# Patient Record
Sex: Female | Born: 1985 | Race: White | Hispanic: No | Marital: Married | Smoking: Former smoker
Health system: Southern US, Community
[De-identification: ages and names within clinical notes are randomized; demographics above are authoritative.]

## PROBLEM LIST (undated history)

## (undated) ENCOUNTER — Inpatient Hospital Stay: Payer: Self-pay

## (undated) DIAGNOSIS — R519 Headache, unspecified: Secondary | ICD-10-CM

## (undated) DIAGNOSIS — Z9889 Other specified postprocedural states: Secondary | ICD-10-CM

## (undated) DIAGNOSIS — F32A Depression, unspecified: Secondary | ICD-10-CM

## (undated) DIAGNOSIS — F329 Major depressive disorder, single episode, unspecified: Secondary | ICD-10-CM

## (undated) DIAGNOSIS — R112 Nausea with vomiting, unspecified: Secondary | ICD-10-CM

## (undated) DIAGNOSIS — F419 Anxiety disorder, unspecified: Secondary | ICD-10-CM

## (undated) HISTORY — PX: CHOLECYSTECTOMY: SHX55

---
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
## 2010-03-13 ENCOUNTER — Emergency Department: Payer: Self-pay | Admitting: Emergency Medicine

## 2010-03-15 ENCOUNTER — Ambulatory Visit: Payer: Self-pay | Admitting: Family Medicine

## 2010-04-08 ENCOUNTER — Ambulatory Visit (HOSPITAL_COMMUNITY): Admission: RE | Admit: 2010-04-08 | Discharge: 2010-04-08 | Payer: Self-pay | Admitting: Neurology

## 2010-11-26 ENCOUNTER — Inpatient Hospital Stay: Payer: Self-pay | Admitting: Psychiatry

## 2011-04-27 ENCOUNTER — Emergency Department: Payer: Self-pay | Admitting: Emergency Medicine

## 2012-07-06 ENCOUNTER — Ambulatory Visit: Payer: Self-pay | Admitting: Orthopedic Surgery

## 2012-07-06 LAB — HCG, QUANTITATIVE, PREGNANCY: Beta Hcg, Quant.: <1 m[IU]/mL — ABNORMAL LOW

## 2013-05-11 ENCOUNTER — Observation Stay: Payer: Self-pay | Admitting: Obstetrics & Gynecology

## 2013-05-11 LAB — URINALYSIS, COMPLETE
Bilirubin,UR: NEGATIVE
Ketone: NEGATIVE
Leukocyte Esterase: NEGATIVE
Protein: NEGATIVE
RBC,UR: 1 /HPF (ref 0–5)
Specific Gravity: 1.018 (ref 1.003–1.030)
Squamous Epithelial: 1
WBC UR: 2 /HPF (ref 0–5)

## 2013-05-11 LAB — CBC WITH DIFFERENTIAL/PLATELET
Basophil #: 0 10*3/uL (ref 0.0–0.1)
Eosinophil #: 0.1 10*3/uL (ref 0.0–0.7)
HCT: 30.8 % — ABNORMAL LOW (ref 35.0–47.0)
HGB: 10.4 g/dL — ABNORMAL LOW (ref 12.0–16.0)
Lymphocyte %: 15.7 %
MCH: 29.1 pg (ref 26.0–34.0)
MCHC: 33.8 g/dL (ref 32.0–36.0)
Monocyte #: 0.8 x10 3/mm (ref 0.2–0.9)
Neutrophil #: 8.9 10*3/uL — ABNORMAL HIGH (ref 1.4–6.5)
Neutrophil %: 76.6 %
Platelet: 270 10*3/uL (ref 150–440)
RBC: 3.59 10*6/uL — ABNORMAL LOW (ref 3.80–5.20)

## 2013-06-10 ENCOUNTER — Encounter: Payer: Self-pay | Admitting: Maternal and Fetal Medicine

## 2013-08-23 ENCOUNTER — Observation Stay: Payer: Self-pay | Admitting: Obstetrics and Gynecology

## 2013-08-23 LAB — PROTEIN / CREATININE RATIO, URINE
Creatinine, Urine: 125.7 mg/dL — ABNORMAL HIGH (ref 30.0–125.0)
Protein/Creat. Ratio: 175 mg/gCREAT (ref 0–200)

## 2013-08-31 ENCOUNTER — Observation Stay: Payer: Self-pay | Admitting: Obstetrics and Gynecology

## 2013-08-31 LAB — PROTEIN / CREATININE RATIO, URINE
CREATININE, URINE: 85.8 mg/dL (ref 30.0–125.0)
PROTEIN/CREAT. RATIO: 163 mg/g{creat} (ref 0–200)
Protein, Random Urine: 14 mg/dL — ABNORMAL HIGH (ref 0–12)

## 2013-09-08 ENCOUNTER — Observation Stay: Payer: Self-pay

## 2013-09-09 ENCOUNTER — Inpatient Hospital Stay: Payer: Self-pay

## 2013-09-09 LAB — CBC WITH DIFFERENTIAL/PLATELET
Basophil #: 0 10*3/uL (ref 0.0–0.1)
Basophil %: 0.3 %
EOS ABS: 0.1 10*3/uL (ref 0.0–0.7)
EOS PCT: 0.8 %
HCT: 33.4 % — ABNORMAL LOW (ref 35.0–47.0)
HGB: 11.4 g/dL — ABNORMAL LOW (ref 12.0–16.0)
LYMPHS PCT: 15 %
Lymphocyte #: 2.4 10*3/uL (ref 1.0–3.6)
MCH: 28.5 pg (ref 26.0–34.0)
MCHC: 34.2 g/dL (ref 32.0–36.0)
MCV: 83 fL (ref 80–100)
MONO ABS: 1.3 x10 3/mm — AB (ref 0.2–0.9)
Monocyte %: 8 %
NEUTROS PCT: 75.9 %
Neutrophil #: 12.2 10*3/uL — ABNORMAL HIGH (ref 1.4–6.5)
Platelet: 243 10*3/uL (ref 150–440)
RBC: 4 10*6/uL (ref 3.80–5.20)
RDW: 14.6 % — ABNORMAL HIGH (ref 11.5–14.5)
WBC: 16.1 10*3/uL — ABNORMAL HIGH (ref 3.6–11.0)

## 2013-09-09 LAB — GC/CHLAMYDIA PROBE AMP

## 2013-09-10 LAB — HEMATOCRIT: HCT: 29.3 % — AB (ref 35.0–47.0)

## 2013-09-19 ENCOUNTER — Emergency Department: Payer: Self-pay | Admitting: Emergency Medicine

## 2013-09-19 LAB — URINALYSIS, COMPLETE
Bilirubin,UR: NEGATIVE
GLUCOSE, UR: NEGATIVE mg/dL (ref 0–75)
KETONE: NEGATIVE
Nitrite: NEGATIVE
Ph: 6 (ref 4.5–8.0)
Protein: NEGATIVE
SPECIFIC GRAVITY: 1.008 (ref 1.003–1.030)
Squamous Epithelial: 1
WBC UR: 60 /HPF (ref 0–5)

## 2013-09-19 LAB — CBC WITH DIFFERENTIAL/PLATELET
BASOS PCT: 0.7 %
Basophil #: 0.1 10*3/uL (ref 0.0–0.1)
EOS PCT: 1.9 %
Eosinophil #: 0.2 10*3/uL (ref 0.0–0.7)
HCT: 39.8 % (ref 35.0–47.0)
HGB: 12.8 g/dL (ref 12.0–16.0)
LYMPHS PCT: 22.8 %
Lymphocyte #: 2.9 10*3/uL (ref 1.0–3.6)
MCH: 27.7 pg (ref 26.0–34.0)
MCHC: 32 g/dL (ref 32.0–36.0)
MCV: 86 fL (ref 80–100)
MONOS PCT: 7.1 %
Monocyte #: 0.9 x10 3/mm (ref 0.2–0.9)
NEUTROS ABS: 8.6 10*3/uL — AB (ref 1.4–6.5)
NEUTROS PCT: 67.5 %
Platelet: 365 10*3/uL (ref 150–440)
RBC: 4.61 10*6/uL (ref 3.80–5.20)
RDW: 14.3 % (ref 11.5–14.5)
WBC: 12.8 10*3/uL — ABNORMAL HIGH (ref 3.6–11.0)

## 2013-09-19 LAB — COMPREHENSIVE METABOLIC PANEL
ALBUMIN: 3.1 g/dL — AB (ref 3.4–5.0)
ALK PHOS: 117 U/L
Anion Gap: 6 — ABNORMAL LOW (ref 7–16)
BILIRUBIN TOTAL: 0.4 mg/dL (ref 0.2–1.0)
BUN: 14 mg/dL (ref 7–18)
CREATININE: 0.59 mg/dL — AB (ref 0.60–1.30)
Calcium, Total: 8.8 mg/dL (ref 8.5–10.1)
Chloride: 108 mmol/L — ABNORMAL HIGH (ref 98–107)
Co2: 25 mmol/L (ref 21–32)
EGFR (African American): >60
EGFR (Non-African Amer.): >60
Glucose: 101 mg/dL — ABNORMAL HIGH (ref 65–99)
Osmolality: 278 (ref 275–301)
POTASSIUM: 4.4 mmol/L (ref 3.5–5.1)
SGOT(AST): 63 U/L — ABNORMAL HIGH (ref 15–37)
SGPT (ALT): 43 U/L (ref 12–78)
SODIUM: 139 mmol/L (ref 136–145)
Total Protein: 7.5 g/dL (ref 6.4–8.2)

## 2013-09-19 LAB — TROPONIN I

## 2013-09-19 LAB — LIPASE, BLOOD: LIPASE: 147 U/L (ref 73–393)

## 2013-09-25 ENCOUNTER — Ambulatory Visit: Payer: Self-pay | Admitting: Surgery

## 2013-09-25 LAB — CBC WITH DIFFERENTIAL/PLATELET
Basophil #: 0.1 10*3/uL (ref 0.0–0.1)
Basophil %: 1.3 %
Eosinophil #: 0.2 10*3/uL (ref 0.0–0.7)
Eosinophil %: 3 %
HCT: 38.8 % (ref 35.0–47.0)
HGB: 12.4 g/dL (ref 12.0–16.0)
LYMPHS ABS: 2.7 10*3/uL (ref 1.0–3.6)
Lymphocyte %: 36.6 %
MCH: 27.9 pg (ref 26.0–34.0)
MCHC: 32.1 g/dL (ref 32.0–36.0)
MCV: 87 fL (ref 80–100)
Monocyte #: 0.5 x10 3/mm (ref 0.2–0.9)
Monocyte %: 7 %
NEUTROS ABS: 3.8 10*3/uL (ref 1.4–6.5)
Neutrophil %: 52.1 %
Platelet: 293 10*3/uL (ref 150–440)
RBC: 4.47 10*6/uL (ref 3.80–5.20)
RDW: 14.3 % (ref 11.5–14.5)
WBC: 7.4 10*3/uL (ref 3.6–11.0)

## 2013-09-25 LAB — URINALYSIS, COMPLETE
Bilirubin,UR: NEGATIVE
GLUCOSE, UR: NEGATIVE mg/dL (ref 0–75)
Ketone: NEGATIVE
NITRITE: NEGATIVE
PH: 6.5 (ref 4.5–8.0)
Specific Gravity: 1.02 (ref 1.003–1.030)

## 2013-09-25 LAB — COMPREHENSIVE METABOLIC PANEL
ALBUMIN: 3.3 g/dL — AB (ref 3.4–5.0)
ALK PHOS: 94 U/L
ALT: 43 U/L (ref 12–78)
ANION GAP: 8 (ref 7–16)
BILIRUBIN TOTAL: 0.3 mg/dL (ref 0.2–1.0)
BUN: 15 mg/dL (ref 7–18)
CHLORIDE: 104 mmol/L (ref 98–107)
Calcium, Total: 9.1 mg/dL (ref 8.5–10.1)
Co2: 29 mmol/L (ref 21–32)
Creatinine: 0.8 mg/dL (ref 0.60–1.30)
EGFR (African American): >60
EGFR (Non-African Amer.): >60
GLUCOSE: 88 mg/dL (ref 65–99)
OSMOLALITY: 282 (ref 275–301)
POTASSIUM: 4 mmol/L (ref 3.5–5.1)
SGOT(AST): 26 U/L (ref 15–37)
SODIUM: 141 mmol/L (ref 136–145)
Total Protein: 7.2 g/dL (ref 6.4–8.2)

## 2013-10-08 ENCOUNTER — Ambulatory Visit: Payer: Self-pay | Admitting: Surgery

## 2013-10-10 LAB — PATHOLOGY REPORT

## 2014-12-20 NOTE — Op Note (Signed)
PATIENT NAME:  [name] Crosby, [name] MR#:  [id number] DATE OF BIRTH:  1986/03/04  DATE OF PROCEDURE:  10/08/2013  PREOPERATIVE DIAGNOSIS: Symptomatic cholelithiasis.   POSTOPERATIVE DIAGNOSIS: Symptomatic cholelithiasis.   PROCEDURE: Laparoscopic cholecystectomy.   SURGEON: Dionne Miloichard Flavia Bruss, M.D.   ANESTHESIA: General with endotracheal tube.   ASSISTANT:  Applebaum, PA-S.   INDICATIONS: This is a patient with recurrent epigastric and right upper quadrant pain associated with fatty food intolerance and workup showing gallstones. Preoperatively, we discussed the rationale for surgery, the options of observation, risk of bleeding, infection, recurrence of symptoms, inability to resolve her symptoms, open procedure, bile duct damage, bile duct leak, retained common bile duct stone, any of which could require further surgery and/or ERCP, stent or papillotomy. This was all reviewed for her in the preop holding area. She understood and agreed to proceed.   FINDINGS: A scar in the anterior surface of the gallbladder with small stones, all signifying prior attacks of gallbladder disease.   DESCRIPTION OF PROCEDURE: The patient was induced with general anesthesia and given IV antibiotics. VT prophylaxis was in place. She was prepped and draped in a sterile fashion. Marcaine was infiltrated in skin and subcutaneous tissues around the infraumbilical area, avoiding an area of piercing. Local anesthetic was infiltrated and then an incision was made. Veress needle was placed. Pneumoperitoneum was obtained. A 5 mm trocar port was placed. The abdominal cavity was explored, and under direct vision, a 10 mm epigastric port and 2 lateral 5 mm ports were placed. The gallbladder was placed on tension. The peritoneum over the infundibulum was incised bluntly. The cystic artery was well identified, doubly clipped and divided. This allowed for good visualization of the cystic duct as I entered the infundibulum. Here, it was doubly  clipped and divided, and the gallbladder was taken from and the gallbladder fossa with electrocautery.  An additional branch of the cystic artery was doubly clipped and divided and then the gallbladder was removed from the gallbladder fossa, passed out through the epigastric port site with the aid of an Endo Catch bag. The area was checked for hemostasis, found to be adequate. There was no sign of bleeding, bile leak or bowel injury. The camera was placed in the epigastric site to view back at the periumbilical site. There was no sign of bleeding or bowel injury or adhesions. Irrigation had been performed; therefore, pneumoperitoneum was released. All ports were removed.  The fascial edges of the epigastric site were approximated with figure-of-eight 0 Vicryls; 4-0 subcuticular Monocryl was used on all skin edges. Steri-Strips, Mastisol and sterile dressings were placed.   The patient tolerated the procedure well. There were no complications. She was taken to the recovery room in stable condition to be discharged in the care of her family. Follow up in 10 days.     ____________________________ Adah Salvageichard E. Excell Seltzerooper, MD rec:dmm D: 10/08/2013 10:44:09 ET T: 10/08/2013 11:05:38 ET JOB#: [id number]  cc: Adah Salvageichard E. Excell Seltzerooper, MD, <Dictator> Lattie HawICHARD E Reice Bienvenue MD ELECTRONICALLY SIGNED 10/08/2013 17:27

## 2014-12-20 NOTE — H&P (Signed)
PATIENT NAME:  [name] Crosby, [name] MR#:  [id number] DATE OF BIRTH:  [birth year]  DATE OF ADMISSION:  10/08/2013  CHIEF COMPLAINT: Recurrent epigastric pain.   HISTORY OF PRESENT ILLNESS: This is a patient with recurrent epigastric right upper quadrant pain associated with fatty food intolerance. She is postpartum and is waiting to go back to work, but has had recurrent episodic pain associated with fatty food intolerance. No history of jaundice or acholic stools. No fevers or chills. She has had some nausea, but no emesis. She was seen in the office where elective laparoscopic cholecystectomy was discussed which she agrees too.  She is here for elective cholecystectomy for control of her symptoms.   PAST MEDICAL HISTORY: Anxiety.   PAST SURGICAL HISTORY: None.   ALLERGIES: None.   FAMILY HISTORY: Noncontributory.   SOCIAL HISTORY: She does not smoke, does not drink alcohol and is at home with her children until going back to work.   REVIEW OF SYSTEMS: A 10 system review has been performed and negative with the exception I mentioned in the HPI.   PHYSICAL EXAMINATION: GENERAL: Healthy-appearing female patient.  HEENT: No scleral icterus.  NECK: No palpable neck nodes.  CHEST: Clear to auscultation.  CARDIAC: Regular rate and rhythm.  ABDOMEN: Soft, nontender.  EXTREMITIES: Without edema.  NEUROLOGIC: Grossly intact.  INTEGUMENT: No jaundice.   LABORATORY VALUES: Show no sign of choledocholithiasis. Ultrasound shows stones.   ASSESSMENT AND PLAN: This is a patient with symptomatic cholelithiasis with recurrent episodic right upper quadrant pain and epigastric pain associated with fatty food intolerance and work-up showing gallstones. She is here for elective laparoscopic cholecystectomy.   We discussed the rationale for surgery, the options of observation, risk of bleeding, infection, recurrence of symptoms, failure to resolve her symptoms, open procedure, bile duct damage, bile duct leak,  retained common bile duct stone, any of which could require further surgery and/or ERCP stent, and papillotomy. This will be reviewed for her in the preop holding area as well prior to surgery.   ____________________________ Adah Salvageichard E. Excell Seltzerooper, MD rec:sg D: 10/08/2013 07:30:40 ET T: 10/08/2013 07:43:47 ET JOB#: [id number]  cc: Adah Salvageichard E. Excell Seltzerooper, MD, <Dictator> Lattie HawICHARD E Tenzin Edelman MD ELECTRONICALLY SIGNED 10/08/2013 8:18

## 2015-01-06 NOTE — H&P (Signed)
L&D Evaluation:  History Expanded:  HPI 29 yo G2P0010 at 37 weeks presentswith ctx and spotting. She was seen on Monday and she was checked and she has been having a bloody discharge since then.   Gravida 2   Term 0   PreTerm 0   Abortion 1   Living 0   Blood Type (Maternal) O positive   Group B Strep Results Maternal (Result >5wks must be treated as unknown) unknown/result > 5 weeks ago   Maternal HIV Negative   Maternal Syphilis Ab Nonreactive   Maternal Varicella Immune   Rubella Results (Maternal) immune   Maternal T-Dap Unknown   [facility]   Presents with contractions, vaginal bleeding   Patient's Medical History No Chronic Illness   Patient's Surgical History none   Medications Pre Natal Vitamins   Allergies NKDA   Social History none   ROS:  ROS All systems were reviewed.  HEENT, CNS, GI, GU, Respiratory, CV, Renal and Musculoskeletal systems were found to be normal.   Exam:  Vital Signs stable  first BP was 148/78   Urine Protein not completed   General no apparent distress   Mental Status clear   Chest clear   Heart normal sinus rhythm   Abdomen gravid, non-tender   Estimated Fetal Weight Average for gestational age   Back no CVAT   Edema no edema   Pelvic no external lesions   Mebranes Intact   Description clear   FHT normal rate with no decels   FHT Description CAT 1   Ucx regular   Skin dry   Lymph no lymphadenopathy   Impression:  Impression early labor   Plan:  Plan UA   Comments will give her time and serial exams. reck BP.   Follow Up Appointment need to schedule. in 1 week   Electronic Signatures: Adria DevonKlett, Tyrene Nader (MD)  (Signed 26-Dec-14 22:07)  Authored: L&D Evaluation   Last Updated: 26-Dec-14 22:07 by Adria DevonKlett, Danyale Ridinger (MD)

## 2015-01-06 NOTE — H&P (Signed)
L&D Evaluation:  History Expanded:  HPI 29 yo G2P0010 EDD of 09/12/13 per LMP and 8 wk US, presents at 6666w4d with c/o leaking fluid followed by large gush at 0130 this am. +FM, + contractions.[facility] for [facility] to care, bipolar disorder (was on lamictal, d/c in early pregnancy, restarted at 24 wks) followed by psych, TDaP given 07/08/13.   Gravida 2   Term 0   PreTerm 0   Abortion 1   Living 0   Blood Type (Maternal) O positive   Group B Strep Results Maternal (Result >5wks must be treated as unknown) negative   Maternal HIV Negative   Maternal Syphilis Ab Nonreactive   Maternal Varicella Immune   Rubella Results (Maternal) immune   Maternal T-Dap Unknown   [facility]   Presents with leaking fluid   Patient's Medical History Bipolar   Patient's Surgical History wisdom teeth   Medications Pre Natal Vitamins  Lamictal   Allergies NKDA   Social History none   ROS:  ROS All systems were reviewed.  HEENT, CNS, GI, GU, Respiratory, CV, Renal and Musculoskeletal systems were found to be normal.   Exam:  Vital Signs stable   Urine Protein not completed   General no apparent distress   Mental Status clear   Chest clear   Heart normal sinus rhythm   Abdomen gravid, tender with contractions   Estimated Fetal Weight Average for gestational age   Back no CVAT   Edema no edema   Pelvic no external lesions, 6/100/-1   Mebranes Ruptured   Description clear   FHT normal rate with no decels, category 1 tracing   Ucx regular, q 2-3 min   Skin dry   Lymph no lymphadenopathy   Impression:  Impression SROM, labor   Plan:  Comments Pt received epidural recently, RN working with anesthesia to ensure adequate pain relief.   Electronic Signatures: Vella KohlerBrothers, Edeline Greening K (CNM)  (Signed 12-Jan-15 08:46)  Authored: L&D Evaluation   Last Updated: 12-Jan-15 08:46 by Vella KohlerBrothers, Ardis Lawley K (CNM)

## 2015-01-06 NOTE — H&P (Signed)
L&D Evaluation:  History Expanded:  HPI 29 yo G2P0010 WF at 22 weeks w estimated date of confinement 09/12/13 who presents w lower suprapubic pains today.  Denies vaginal bleeding or LOF.  +FM.  Denies n/v/d, f/c, CP, SOB, h/a, LBP.   [facility].  Bipolar, on and off meds.  O+, RI, VI.   Gravida 2   Term 0   PreTerm 0   Abortion 1   Living 0   Blood Type (Maternal) O positive   Group B Strep Results Maternal (Result >5wks must be treated as unknown) unknown/result > 5 weeks ago   Maternal Varicella Immune   Rubella Results (Maternal) immune   Presents with abdominal pain   Patient's Medical History BIPOLAR   Patient's Surgical History none   Medications Pre Natal Vitamins  On Lamictal in past   Allergies NKDA   Social History none   Family History Non-Contributory   ROS:  ROS All systems were reviewed.  HEENT, CNS, GI, GU, Respiratory, CV, Renal and Musculoskeletal systems were found to be normal.   Exam:  Vital Signs stable   General no apparent distress   Mental Status clear   Chest clear   Heart normal sinus rhythm, no murmur/gallop/rubs   Abdomen gravid, non-tender   Estimated Fetal Weight Average for gestational age   Back no CVAT   Edema no edema   Pelvic no external lesions, cervix closed and thick   FHT normal rate with no decels   Ucx absent   Impression:  Impression Abd Pain, no signs Preterm Labor.   Plan:  Plan UA, EFM/NST   Comments CBC normal.   Electronic Signatures: Letitia LibraHarris, Tola Meas Paul (MD)  (Signed 13-Sep-14 13:36)  Authored: L&D Evaluation   Last Updated: 13-Sep-14 13:36 by Letitia LibraHarris, Kayleeann Huxford Paul (MD)

## 2015-01-06 NOTE — H&P (Signed)
L&D Evaluation:  History Expanded:  HPI 29 yo G2P0010 at 2879w2d gestational age by LMP whose pregnancy has been complicated by a history of bipolar disorder (was on lamictal, but discontinued once she found out she was pregnant).  She presents with contractions for several hours. She notes positive fetal movement, no leakage of fluid, no vaginal bleeding.  TDaP given 07/08/13.   Gravida 2   Term 0   PreTerm 0   Abortion 1   Living 0   Blood Type (Maternal) O positive   Group B Strep Results Maternal (Result >5wks must be treated as unknown) negative   Maternal HIV Negative   Maternal Syphilis Ab Nonreactive   Maternal Varicella Immune   Rubella Results (Maternal) immune   Maternal T-Dap Unknown   [facility]   Presents with contractions   Patient's Medical History No Chronic Illness   Patient's Surgical History none   Medications Pre Natal Vitamins   Allergies NKDA   Social History none   ROS:  ROS All systems were reviewed.  HEENT, CNS, GI, GU, Respiratory, CV, Renal and Musculoskeletal systems were found to be normal.   Exam:  Vital Signs stable  T 98.2, first BP was 141/82, since then 123-136/76-85, 80-90s, RR 20   Urine Protein not completed   General no apparent distress   Mental Status clear   Chest clear   Heart normal sinus rhythm   Abdomen gravid, non-tender   Estimated Fetal Weight Average for gestational age   Back no CVAT   Edema no edema   Pelvic no external lesions, 1-2/50%/-3   Mebranes Intact   Description clear   FHT normal rate with no decels   FHT Description 135/mod var/+accels/no decels   Ucx regular, 3 q 10 min   Skin dry   Lymph no lymphadenopathy   Impression:  Impression early labor, reactive NST, Early labor   Plan:  Comments - has been here since 3am with no apparent change in her cervix.  She states that her contractions have increased in intensity.  Will give her the option to stay for longer,  or to go home and labor there.She decides to go home for now. - preeclampsia precautions.  Elevated BP initially, urine prot/creatinine ratio normal.  no preeclampsia symptoms or severe features and BPs normal after first one.   Follow Up Appointment in 1 week   Labs:  Lab Results:  Misc Urine Chem:  03-Jan-15 03:36   Protein/Creat Ratio (comp) 163 (Result(s) reported on 31 Aug 2013 at 04:52AM.)   Electronic Signatures: Conard NovakJackson, Paeton Latouche D (MD)  (Signed 03-Jan-15 08:14)  Authored: L&D Evaluation, Labs   Last Updated: 03-Jan-15 08:14 by Conard NovakJackson, Hilberto Burzynski D (MD)

## 2015-05-15 ENCOUNTER — Other Ambulatory Visit: Payer: Self-pay | Admitting: Orthopedic Surgery

## 2015-05-15 DIAGNOSIS — M545 Low back pain, unspecified: Secondary | ICD-10-CM

## 2015-05-23 ENCOUNTER — Ambulatory Visit
Admission: RE | Admit: 2015-05-23 | Discharge: 2015-05-23 | Disposition: A | Discharge status: Home / Self Care | Payer: Commercial Managed Care - HMO | Source: Ambulatory Visit | Attending: Orthopedic Surgery | Admitting: Orthopedic Surgery

## 2015-05-23 DIAGNOSIS — M545 Low back pain, unspecified: Secondary | ICD-10-CM

## 2015-05-23 DIAGNOSIS — M5126 Other intervertebral disc displacement, lumbar region: Secondary | ICD-10-CM | POA: Insufficient documentation

## 2015-07-22 ENCOUNTER — Other Ambulatory Visit: Payer: Self-pay | Admitting: Orthopedic Surgery

## 2015-07-22 DIAGNOSIS — M2392 Unspecified internal derangement of left knee: Secondary | ICD-10-CM

## 2015-08-13 ENCOUNTER — Ambulatory Visit: Payer: Commercial Managed Care - HMO

## 2016-07-05 ENCOUNTER — Emergency Department
Admission: EM | Admit: 2016-07-05 | Discharge: 2016-07-05 | Disposition: A | Discharge status: Home / Self Care | Payer: Managed Care, Other (non HMO) | Attending: Emergency Medicine | Admitting: Emergency Medicine

## 2016-07-05 DIAGNOSIS — O2341 Unspecified infection of urinary tract in pregnancy, first trimester: Secondary | ICD-10-CM | POA: Diagnosis not present

## 2016-07-05 DIAGNOSIS — Z87891 Personal history of nicotine dependence: Secondary | ICD-10-CM | POA: Insufficient documentation

## 2016-07-05 DIAGNOSIS — O209 Hemorrhage in early pregnancy, unspecified: Secondary | ICD-10-CM | POA: Diagnosis not present

## 2016-07-05 DIAGNOSIS — Z3A01 Less than 8 weeks gestation of pregnancy: Secondary | ICD-10-CM | POA: Diagnosis not present

## 2016-07-05 DIAGNOSIS — R1084 Generalized abdominal pain: Secondary | ICD-10-CM

## 2016-07-05 DIAGNOSIS — O219 Vomiting of pregnancy, unspecified: Secondary | ICD-10-CM | POA: Diagnosis present

## 2016-07-05 DIAGNOSIS — R112 Nausea with vomiting, unspecified: Secondary | ICD-10-CM

## 2016-07-05 DIAGNOSIS — R197 Diarrhea, unspecified: Secondary | ICD-10-CM | POA: Insufficient documentation

## 2016-07-05 DIAGNOSIS — N939 Abnormal uterine and vaginal bleeding, unspecified: Secondary | ICD-10-CM

## 2016-07-05 LAB — COMPREHENSIVE METABOLIC PANEL
ALK PHOS: 46 U/L (ref 38–126)
ALT: 46 U/L (ref 14–54)
ANION GAP: 7 (ref 5–15)
AST: 32 U/L (ref 15–41)
Albumin: 4.1 g/dL (ref 3.5–5.0)
BILIRUBIN TOTAL: 0.4 mg/dL (ref 0.3–1.2)
BUN: 8 mg/dL (ref 6–20)
CALCIUM: 8.9 mg/dL (ref 8.9–10.3)
CO2: 24 mmol/L (ref 22–32)
Chloride: 107 mmol/L (ref 101–111)
Creatinine, Ser: 0.59 mg/dL (ref 0.44–1.00)
GFR calc non Af Amer: >60 mL/min (ref >60)
Glucose, Bld: 92 mg/dL (ref 65–99)
Potassium: 3.6 mmol/L (ref 3.5–5.1)
Sodium: 138 mmol/L (ref 135–145)
TOTAL PROTEIN: 7.5 g/dL (ref 6.5–8.1)

## 2016-07-05 LAB — URINALYSIS COMPLETE WITH MICROSCOPIC (ARMC ONLY)
BILIRUBIN URINE: NEGATIVE
GLUCOSE, UA: NEGATIVE mg/dL
NITRITE: NEGATIVE
Protein, ur: NEGATIVE mg/dL
SPECIFIC GRAVITY, URINE: 1.017 (ref 1.005–1.030)
pH: 7 (ref 5.0–8.0)

## 2016-07-05 LAB — CBC
HEMATOCRIT: 39.8 % (ref 35.0–47.0)
HEMOGLOBIN: 13.1 g/dL (ref 12.0–16.0)
MCH: 28.2 pg (ref 26.0–34.0)
MCHC: 32.9 g/dL (ref 32.0–36.0)
MCV: 85.8 fL (ref 80.0–100.0)
Platelets: 295 10*3/uL (ref 150–440)
RBC: 4.64 MIL/uL (ref 3.80–5.20)
RDW: 13.7 % (ref 11.5–14.5)
WBC: 9.6 10*3/uL (ref 3.6–11.0)

## 2016-07-05 MED ORDER — NITROFURANTOIN MONOHYD MACRO 100 MG PO CAPS: 100.0000 mg | ORAL_CAPSULE | Freq: Two times a day (BID) | ORAL | 0 refills | Status: AC

## 2016-07-05 MED ORDER — ONDANSETRON HCL 4 MG/2ML IJ SOLN
INTRAMUSCULAR | Status: AC
  Administered 2016-07-05: 4 mg via INTRAVENOUS
  Filled 2016-07-05: qty 2

## 2016-07-05 MED ORDER — ONDANSETRON 4 MG PO TBDP: 4.0000 mg | ORAL_TABLET | Freq: Three times a day (TID) | ORAL | 0 refills | Status: DC | PRN

## 2016-07-05 MED ORDER — SODIUM CHLORIDE 0.9 % IV BOLUS (SEPSIS)
1000.0000 mL | Freq: Once | INTRAVENOUS | Status: AC
Start: 2016-07-05 — End: 2016-07-05
  Administered 2016-07-05: 1000 mL via INTRAVENOUS

## 2016-07-05 MED ORDER — NITROFURANTOIN MONOHYD MACRO 100 MG PO CAPS
100.0000 mg | ORAL_CAPSULE | Freq: Once | ORAL | Status: AC
  Administered 2016-07-05: 100 mg via ORAL
  Filled 2016-07-05: qty 1

## 2016-07-05 MED ORDER — ONDANSETRON HCL 4 MG/2ML IJ SOLN
4.0000 mg | Freq: Once | INTRAMUSCULAR | Status: AC
  Administered 2016-07-05: 4 mg via INTRAVENOUS

## 2016-07-05 NOTE — ED Provider Notes (Signed)
[facility] Emergency Department Provider Note  ____________________________________________  Time seen: Approximately 11:14 AM  I have reviewed the triage vital signs and the nursing notes.   HISTORY  Chief Complaint Emesis and Diarrhea    HPI [name] Crosby is a 30 y.o. female G3P1A1 [redacted] wks pregnant by LMP and U/S presenting w/ inability to tolerate PO, n/v/d.  Pt reports that for the past three days, she has had n/v despite Phenergan, Phenergan gel, Zofran ODT, and Diclegis.  Stool is loose, watery, nonbloody and diarrhea has improved since yesterday.  Mild lower abdominal cramping that is intermittent.  No fever, chills, or urinary sx's.  No lightheadedness or syncope.  Pt has a known subchorionic hemorrhage dx'd 5d ago with continued minimal vaginal bleeding that is unchanged in amount or flow; no passage of tissue or signficant clots.   History reviewed. No pertinent past medical history.  There are no active problems to display for this patient.   Past Surgical History:  Procedure Laterality Date  . CHOLECYSTECTOMY        Allergies Patient has no known allergies.  No family history on file.  Social History Social History  Substance Use Topics  . Smoking status: Former Games developermoker  . Smokeless tobacco: Never Used  . Alcohol use No    Review of Systems Constitutional: No fever/chills.  No lightheadedness or syncope. Eyes: No visual changes. ENT: No sore throat. No congestion or rhinorrhea. Cardiovascular: Denies chest pain. Denies palpitations. Respiratory: Denies shortness of breath.  No cough. Gastrointestinal: Mild lower abdominal cramping.  Positive nausea, pos vomiting.  + diarrhea.  No constipation. Genitourinary: Negative for dysuria. Pos mild unchanged vaginal bleeding.  No vaginal discharge. Musculoskeletal: Negative for back pain. Skin: Negative for rash. Neurological: Negative for headaches. No focal numbness, tingling or  weakness.   10-point ROS otherwise negative.  ____________________________________________   PHYSICAL EXAM:  VITAL SIGNS: ED Triage Vitals  Enc Vitals Group     BP 07/05/16 1028 129/70     Pulse Rate 07/05/16 1028 89     Resp 07/05/16 1028 16     Temp 07/05/16 1028 98.3 F (36.8 C)     Temp Source 07/05/16 1028 Oral     SpO2 07/05/16 1028 98 %     Weight 07/05/16 1029 210 lb (95.3 kg)     Height 07/05/16 1029 5\' 6"  (1.676 m)     Head Circumference --      Peak Flow --      Pain Score 07/05/16 1030 4     Pain Loc --      Pain Edu? --      Excl. in GC? --     Constitutional: Alert and oriented. Well appearing and in no acute distress. Answers questions appropriately. Eyes: Conjunctivae are normal.  EOMI. No scleral icterus. Head: Atraumatic. Nose: No congestion/rhinnorhea. Mouth/Throat: Mucous membranes are mildly dry.  Neck: No stridor.  Supple.  No meningismus. Cardiovascular: Normal rate, regular rhythm. No murmurs, rubs or gallops.  Respiratory: Normal respiratory effort.  No accessory muscle use or retractions. Lungs CTAB.  No wheezes, rales or ronchi. Gastrointestinal: Soft and nondistended.  Minimal discomfort with palpation over the midline suprapubic region.  No guarding or rebound.  No peritoneal signs. GU: deferred as pt has stable, known bleeding from subchorionic hemorrhage that is unchanged. Musculoskeletal: No LE edema.  Neurologic:  A&Ox3.  Speech is clear.  Face and smile are symmetric.  EOMI.  Moves all extremities well. Skin:  Skin is warm, dry and intact. No rash noted. Psychiatric: Mood and affect are normal. Speech and behavior are normal.  Normal judgement.  ____________________________________________   LABS (all labs ordered are listed, but only abnormal results are displayed)  Labs Reviewed  URINALYSIS COMPLETEWITH MICROSCOPIC ([facility] ONLY) - Abnormal; Notable for the following:       Result Value   Color, Urine YELLOW (*)    APPearance  CLOUDY (*)    Ketones, ur 1+ (*)    Hgb urine dipstick 3+ (*)    Leukocytes, UA 2+ (*)    Bacteria, UA RARE (*)    Squamous Epithelial / LPF 6-30 (*)    All other components within normal limits  URINE CULTURE  CBC  COMPREHENSIVE METABOLIC PANEL   ____________________________________________  EKG  Not indicated ____________________________________________  RADIOLOGY  No results found.  ____________________________________________   PROCEDURES  Procedure(s) performed: None  Procedures  Critical Care performed: No ____________________________________________   INITIAL IMPRESSION / ASSESSMENT AND PLAN / ED COURSE  Pertinent labs & imaging results that were available during my care of the patient were reviewed by me and considered in my medical decision making (see chart for details).  30 y.o. G3P1A1 [redacted] wks pregnant w/ hx of hyperemesis gravidarum presenting w/ 3d of n/v/d.  Overall the pt has stable VS and a reassuring examination.  She may have a foodborne or GI illness, or hyperemesis.  There is no finding on her examination that would be suggestive of an acute surgical or infectious pathology intraabdominally, but I have discussed appy return precautions.  Plan labs, r/o UTI, IVF, anti-emetic, re-eval.  At this time, no further eval is necessary for her stable vaginal bleeding from known subchorionic hemorrhage; pt reports she is Rh positive.  ----------------------------------------- 12:40 PM on 07/05/2016 -----------------------------------------  At this time, the patient's symptoms have completely resolved. She has finished 1 L of intravenous fluid, and has been tolerating liquid. I offered her a second liter, and she prefers to go home for oral rehydration. The patient's laboratory studies are reassuring, she has a UTI and will be treated with Macrobid and received the first dose here. I have given the patient strict return precautions as well as follow-up  instructions.  ____________________________________________  FINAL CLINICAL IMPRESSION(S) / ED DIAGNOSES  Final diagnoses:  Nausea vomiting and diarrhea  Generalized abdominal pain  Vaginal bleeding  Urinary tract infection in mother during first trimester of pregnancy    Clinical Course       NEW MEDICATIONS STARTED DURING THIS VISIT:  New Prescriptions   NITROFURANTOIN, MACROCRYSTAL-MONOHYDRATE, (MACROBID) 100 MG CAPSULE    Take 1 capsule (100 mg total) by mouth 2 (two) times daily.   ONDANSETRON (ZOFRAN ODT) 4 MG DISINTEGRATING TABLET    Take 1-2 tablets (4-8 mg total) by mouth every 8 (eight) hours as needed for nausea or vomiting.      Rockne MenghiniAnne-Caroline Chanele Douglas, MD 07/05/16 1241

## 2016-07-05 NOTE — Discharge Instructions (Signed)
Please drink plenty of fluid to stay well-hydrated. Eat small regular meals throughout the day. You may take a clear liquid diet for the next 12-24 hours, then advance to a bland BRAT diet as described in the paperwork.  Continue your diet clear just, and takes Zofran as needed for nausea and vomiting.  Return to the emergency department if you develop worsening abdominal discomfort, increased bleeding, lightheadedness or fainting, inability to keep down fluids, fever, or any other symptoms concerning to you.

## 2016-07-05 NOTE — ED Triage Notes (Addendum)
Pt c/o N/V that started Sunday, began having diarrhea yesterday.. Pt states she is [redacted] weeks pregnant, vaginal bleeding few days and [facility] and told [facility], pregnancy is fine

## 2016-07-07 LAB — URINE CULTURE

## 2016-08-03 ENCOUNTER — Other Ambulatory Visit: Payer: Self-pay | Admitting: Obstetrics and Gynecology

## 2016-08-03 DIAGNOSIS — Z369 Encounter for antenatal screening, unspecified: Secondary | ICD-10-CM

## 2016-08-03 LAB — OB RESULTS CONSOLE RUBELLA ANTIBODY, IGM: RUBELLA: IMMUNE

## 2016-08-03 LAB — OB RESULTS CONSOLE VARICELLA ZOSTER ANTIBODY, IGG: VARICELLA IGG: IMMUNE

## 2016-08-03 LAB — OB RESULTS CONSOLE HEPATITIS B SURFACE ANTIGEN: HEP B S AG: NEGATIVE

## 2016-08-03 LAB — OB RESULTS CONSOLE HIV ANTIBODY (ROUTINE TESTING): HIV: NONREACTIVE

## 2016-08-18 ENCOUNTER — Ambulatory Visit (HOSPITAL_BASED_OUTPATIENT_CLINIC_OR_DEPARTMENT_OTHER)
Admission: RE | Admit: 2016-08-18 | Discharge: 2016-08-18 | Disposition: A | Discharge status: Home / Self Care | Payer: Managed Care, Other (non HMO) | Source: Ambulatory Visit | Attending: Obstetrics and Gynecology | Admitting: Obstetrics and Gynecology

## 2016-08-18 ENCOUNTER — Ambulatory Visit
Admission: RE | Admit: 2016-08-18 | Discharge: 2016-08-18 | Disposition: A | Discharge status: Home / Self Care | Payer: Managed Care, Other (non HMO) | Source: Ambulatory Visit | Attending: Maternal and Fetal Medicine | Admitting: Maternal and Fetal Medicine

## 2016-08-18 ENCOUNTER — Ambulatory Visit: Payer: Managed Care, Other (non HMO)

## 2016-08-18 DIAGNOSIS — Z369 Encounter for antenatal screening, unspecified: Secondary | ICD-10-CM

## 2016-08-18 DIAGNOSIS — Z3A13 13 weeks gestation of pregnancy: Secondary | ICD-10-CM | POA: Diagnosis not present

## 2016-08-18 DIAGNOSIS — Z3689 Encounter for other specified antenatal screening: Secondary | ICD-10-CM | POA: Diagnosis not present

## 2016-08-18 HISTORY — DX: Major depressive disorder, single episode, unspecified: F32.9

## 2016-08-18 HISTORY — DX: Depression, unspecified: F32.A

## 2016-08-18 HISTORY — DX: Anxiety disorder, unspecified: F41.9

## 2016-08-18 NOTE — Progress Notes (Signed)
[name] Crosby K Length of Consultation: 40 minutes   [name] Crosby  was referred to [facility] for genetic counseling to review prenatal screening and testing options.  This note summarizes the information we discussed.    We offered the following routine screening tests for this pregnancy:  First trimester screening, which includes nuchal translucency ultrasound screen and first trimester maternal serum marker screening.  The nuchal translucency has approximately an 80% detection rate for Down syndrome and can be positive for other chromosome abnormalities as well as congenital heart defects.  When combined with a maternal serum marker screening, the detection rate is up to 90% for Down syndrome and up to 97% for trisomy 18.     Maternal serum marker screening, a blood test that measures pregnancy proteins, can provide risk assessments for Down syndrome, trisomy 18, and open neural tube defects (spina bifida, anencephaly). Because it does not directly examine the fetus, it cannot positively diagnose or rule out these problems.  Targeted ultrasound uses high frequency sound waves to create an image of the developing fetus.  An ultrasound is often recommended as a routine means of evaluating the pregnancy.  It is also used to screen for fetal anatomy problems (for example, a heart defect) that might be suggestive of a chromosomal or other abnormality.   Should these screening tests indicate an increased concern, then the following additional testing options would be offered:  The chorionic villus sampling procedure is available for first trimester chromosome analysis.  This involves the withdrawal of a small amount of chorionic villi (tissue from the developing placenta).  Risk of pregnancy loss is estimated to be approximately 1 in 200 to 1 in 100 (0.5 to 1%).  There is approximately a 1% (1 in 100) chance that the CVS chromosome results will be unclear.  Chorionic villi cannot  be tested for neural tube defects.     Amniocentesis involves the removal of a small amount of amniotic fluid from the sac surrounding the fetus with the use of a thin needle inserted through the maternal abdomen and uterus.  Ultrasound guidance is used throughout the procedure.  Fetal cells from amniotic fluid are directly evaluated and > 99.5% of chromosome problems and > 98% of open neural tube defects can be detected. This procedure is generally performed after the 15th week of pregnancy.  The main risks to this procedure include complications leading to miscarriage in less than 1 in 200 cases (0.5%).  As another option for information if the pregnancy is suspected to be an an increased chance for certain chromosome conditions, we also reviewed the availability of cell free fetal DNA testing from maternal blood to determine whether or not the baby may have either Down syndrome, trisomy 43, or trisomy 34.  This test utilizes a maternal blood sample and DNA sequencing technology to isolate circulating cell free fetal DNA from maternal plasma.  The fetal DNA can then be analyzed for DNA sequences that are derived from the three most common chromosomes involved in aneuploidy, chromosomes 13, 18, and 21.  If the overall amount of DNA is greater than the expected level for any of these chromosomes, aneuploidy is suspected.  While we do not consider it a replacement for invasive testing and karyotype analysis, a negative result from this testing would be reassuring, though not a guarantee of a normal chromosome complement for the baby.  An abnormal result is certainly suggestive of an abnormal chromosome complement, though we would still recommend  CVS or amniocentesis to confirm any findings from this testing.   Cystic Fibrosis and Spinal Muscular Atrophy (SMA) screening were also discussed with the patient. Both conditions are recessive, which means that both parents must be carriers in order to have a child  with the disease.  Cystic fibrosis (CF) is one of the most common genetic conditions in persons of Caucasian ancestry.  This condition occurs in approximately 1 in 2,500 Caucasian persons and results in thickened secretions in the lungs, digestive, and reproductive systems.  For a baby to be at risk for having CF, both of the parents must be carriers for this condition.  Approximately 1 in 425 Caucasian persons is a carrier for CF.  Current carrier testing looks for the most common mutations in the gene for CF and can detect approximately 90% of carriers in the Caucasian population.  This means that the carrier screening can greatly reduce, but cannot eliminate, the chance for an individual to have a child with CF.  If an individual is found to be a carrier for CF, then carrier testing would be available for the partner. As part of [place]'s newborn screening profile, all babies born in the state of [place] will have a two-tier screening process.  Specimens are first tested to determine the concentration of immunoreactive trypsinogen (IRT).  The top 5% of specimens with the highest IRT values then undergo DNA testing using a panel of over 40 common CF mutations. SMA is a neurodegenerative disorder that leads to atrophy of skeletal muscle and overall weakness.  This condition is also more prevalent in the Caucasian population, with 1 in 40-1 in 60 persons being a carrier and 1 in 6,000-1 in 10,000 children being affected.  There are multiple forms of the disease, with some causing death in infancy to other forms with survival into adulthood.  The genetics of SMA is complex, but carrier screening can detect up to 95% of carriers in the Caucasian population.  Similar to CF, a negative result can greatly reduce, but cannot eliminate, the chance to have a child with SMA.  We obtained a detailed family history and pregnancy history.  The patient stated that she has been diagnosed with anxiety and that  several paternal relatives have also had mental health conditions.  We reviewed that mental health conditions may often present differently in different family members and that there may be genetic as well as situational factors involved in the developmental of mental health conditions.  Currently no genetic testing is available to help assess the chance for have one of these conditions.  She also reported one maternal distant relative with developmental disabilities due to complications with the umbilical cord at birth.  If his condition is the result of hypoxia, then we would not expected an increased risk for a similar condition in this pregnancy.  The remainder of the family history was reported to be unremarkable for birth defects, mental retardation, recurrent pregnancy loss or known chromosome abnormalities.  Ms. Puryear stated that this is her second pregnancy, the first with her current partner.  She has a healthy daughter from her first marriage.  She reported no complications or exposures in this pregnancy that would be expected to increase the risk for birth defects.  Prior to pregnancy, she was taking Lamictal and Xanax for anxiety, but she stopped those prior to [redacted] weeks gestation.  She is also taking Zofran, Phenergan and Diclegis as needed for nausea.  After consideration of the options,  Ms. Orion Modesthlgren elected to proceed with first trimester screening and to declined carrier screening for CF and SMA.  An ultrasound was performed at the time of the visit.  The gestational age was consistent with  13 weeks.  Fetal anatomy could not be assessed due to early gestational age.  Please refer to the ultrasound report for details of that study.  Ms. Riggsbee was encouraged to call with questions or concerns.  We can be contacted at [phone].    Cherly Andersoneborah F. Earlisha Sharples, MS, CGC

## 2016-08-25 ENCOUNTER — Telehealth: Payer: Self-pay | Admitting: Obstetrics and Gynecology

## 2016-08-25 NOTE — Telephone Encounter (Signed)
Ms. [name] Crosby  elected to undergo First Trimester screening on 08/18/2016.  To review, first trimester screening, includes nuchal translucency ultrasound screen and/or first trimester maternal serum marker screening.  The nuchal translucency has approximately an 80% detection rate for Down syndrome and can be positive for other chromosome abnormalities as well as heart defects.  When combined with a maternal serum marker screening, the detection rate is up to 90% for Down syndrome and up to 97% for trisomy 13 and 18.     The results of the First Trimester Nuchal Translucency and Biochemical Screening were within normal range.  The risk for Down syndrome is now estimated to be 1 in 2,646.  The risk for Trisomy 13/18 is less than 1 in 10,000  Should more definitive information be desired, we would offer amniocentesis.  Because we do not yet know the effectiveness of combined first and second trimester screening, we do not recommend a maternal serum screen to assess the chance for chromosome conditions.  However, if screening for neural tube defects is desired, maternal serum screening for AFP only can be performed between 15 and [redacted] weeks gestation.     [name] Andersoneborah F. Belford Pascucci, MS, CGC

## 2016-08-29 NOTE — L&D Delivery Note (Signed)
Delivery Note At 12:27 AM a viable and healthy female "[name] Crosby" was delivered via Vaginal, Spontaneous Delivery (Presentation: ROA, compounding hand).  APGAR: 8,9 ; weight pending .   Placenta status: expressed intact.  Cord: 3VC with the following complications: none.  Cord pH: not collected  Anesthesia:  epidural Episiotomy:  none Lacerations:  Small posterior forchette Suture Repair: 3.0 vicryl rapide Est. Blood Loss (mL):  300  Mom to postpartum.  Baby to Couplet care / Skin to Skin.  30yo G2P1001 at 38+5wks presenting in active labor. Received amp for GBS ppx, Augmentation with pitocin and AROM for clear fluid. She entered the second stage of labor and pushed well over an intact perineum, delivering the fetal head and right arm in a single contraction. Anterior arm followed immediately. No nuchal cord. Vigorous and crying baby was placed on maternal abdomen; delayed cord clamping. Father cut the cord. The placenta delivered and was intact. A small laceration was repaired with a single stitch and excellent hemostasis was noted. The fundus was firm. Postpartum pitocin was started. Baby was placed skin to skin. Bleeding was minimal.  Christeen DouglasBethany Ranay Ketter 02/18/2017, 12:47 AM

## 2016-10-03 ENCOUNTER — Telehealth: Payer: Self-pay

## 2017-01-25 LAB — OB RESULTS CONSOLE GBS: GBS: POSITIVE

## 2017-01-25 LAB — OB RESULTS CONSOLE GC/CHLAMYDIA
Chlamydia: NEGATIVE
GC PROBE AMP, GENITAL: NEGATIVE

## 2017-02-01 LAB — OB RESULTS CONSOLE RPR: RPR: NONREACTIVE

## 2017-02-13 ENCOUNTER — Inpatient Hospital Stay
Admission: EM | Admit: 2017-02-13 | Discharge: 2017-02-13 | Disposition: A | Discharge status: Home / Self Care | Payer: 59 | Attending: Obstetrics & Gynecology | Admitting: Obstetrics & Gynecology

## 2017-02-13 ENCOUNTER — Other Ambulatory Visit: Payer: Self-pay | Admitting: Obstetrics and Gynecology

## 2017-02-13 DIAGNOSIS — F419 Anxiety disorder, unspecified: Secondary | ICD-10-CM | POA: Diagnosis not present

## 2017-02-13 DIAGNOSIS — Z9049 Acquired absence of other specified parts of digestive tract: Secondary | ICD-10-CM | POA: Diagnosis not present

## 2017-02-13 DIAGNOSIS — O479 False labor, unspecified: Secondary | ICD-10-CM

## 2017-02-13 DIAGNOSIS — F329 Major depressive disorder, single episode, unspecified: Secondary | ICD-10-CM | POA: Insufficient documentation

## 2017-02-13 DIAGNOSIS — R102 Pelvic and perineal pain: Secondary | ICD-10-CM | POA: Insufficient documentation

## 2017-02-13 DIAGNOSIS — O26893 Other specified pregnancy related conditions, third trimester: Secondary | ICD-10-CM | POA: Diagnosis not present

## 2017-02-13 DIAGNOSIS — Z3A38 38 weeks gestation of pregnancy: Secondary | ICD-10-CM | POA: Insufficient documentation

## 2017-02-13 DIAGNOSIS — Z87891 Personal history of nicotine dependence: Secondary | ICD-10-CM | POA: Diagnosis not present

## 2017-02-13 DIAGNOSIS — O99343 Other mental disorders complicating pregnancy, third trimester: Secondary | ICD-10-CM | POA: Diagnosis not present

## 2017-02-13 NOTE — Discharge Summary (Signed)
Patient discharged with instructions on follow up appointment, labor precautions, pain management, and when to seek medical attention. Patient verbalized understanding of discharge instructions. Patient ambulatory at discharge with steady gait. Patient discharged with significant other.

## 2017-02-13 NOTE — Discharge Summary (Signed)
Obstetric Discharge Summary   Patient ID: Gevena CottonRebekkah L Hodder MRN: [id number] DOB/AGE: 29/08/1985 29 y.o.   Date of Admission: 02/13/2017  Date of Discharge:  02/13/17 Admitting Diagnosis: IUP at 6528w0d  Secondary Diagnosis: B-H's contractions   Mode of Delivery: N/A     Discharge Diagnosis: Term delivery with B-H's contractions   Intrapartum Procedures: N/A   Post partum procedures: N/A  Complications:none, few elevated BP's that normalized   Brief Hospital Course  Jordon L Orion Modesthlgren is a G3P0 who had "UC's becoming more uncomfortable after work today necessitating evalution as pt thought she may be in labor. After obs, pt had UC's which became milder and felt she wanted to go home till labor began.  she  was ambulating, voiding without difficulty and tolerating regular diet.  She was deemed stable for discharge to home.     Labs: CBC Latest Ref Rng & Units 07/05/2016 09/25/2013 09/19/2013  WBC 3.6 - 11.0 K/uL 9.6 7.4 12.8(H)  Hemoglobin 12.0 - 16.0 g/dL 40.913.1 81.112.4 91.412.8  Hematocrit 35.0 - 47.0 % 39.8 38.8 39.8  Platelets 150 - 440 K/uL 295 293 365   Physical exam:  Blood pressure 138/80, pulse 86, temperature 98.3 F (36.8 C), temperature source Oral, resp. rate 18, height 5\' 6"  (1.676 m), weight 209 lb (94.8 kg), last menstrual period 05/23/2016. General: alert and no distress Resp reg and non-labored Abdomen: Gravid Extremities: No evidence of DVT seen on physical exam. No lower extremity edema.  Discharge Instructions: FU with worsening of S/S of labor, ROM VB or if baby stops moving.  Activity:Up Ad lib Diet: Regular Medications:  Outpatient follow up:  Follow-up Information    [facility] .   Contact information: [address] [facility] [place] [id number] [phone]         Postpartum contraception: to be determined  Discharged Condition: Stable   Discharged to: Home    Barbaraann RondoCaron W [place], [place] 02/13/2017

## 2017-02-13 NOTE — Progress Notes (Addendum)
[name] Crosby is a 31 y.o. female. She is at 3552w0d gestation. Patient's last menstrual period was 05/23/2016. Estimated Date of Delivery: 02/27/17. Dates C/W US. Presents for poss labor S/S.   [facility]   Chief complaint: "cramping and lower abd discomfort after work tonight"   Location: abdominal  Onset/timing:q 4-7 mins Duration:lasting 50-60 secs  Quality: mild to mod Severity:5 on 1-10 scale  Aggravating or alleviating conditions: No LOF, NO VB or decreased fM Associated signs/symptoms:   S: Hurting occas with UC's, + CTX, no VB.no LOF,  Active fetal movement.+  Maternal Medical History:   Past Medical History:  Diagnosis Date  . Anxiety   . Depression     Past Surgical History:  Procedure Laterality Date  . CHOLECYSTECTOMY      No Known Allergies  Prior to Admission medications   Medication Sig Start Date End Date Taking? Authorizing Provider  ALPRAZolam Prudy Feeler(XANAX) 0.5 MG tablet Take 0.5 mg by mouth at bedtime as needed for anxiety.   Yes [provider]  lamoTRIgine (LAMICTAL) 100 MG tablet Take 100 mg by mouth daily.   Yes [provider]  Prenatal Vit-Fe Fumarate-FA (MULTIVITAMIN-PRENATAL) 27-0.8 MG TABS tablet Take 1 tablet by mouth daily at 12 noon.   Yes [provider]  zolpidem (AMBIEN) 5 MG tablet Take 5 mg by mouth at bedtime as needed for sleep.   Yes [provider]  ondansetron (ZOFRAN ODT) 4 MG disintegrating tablet Take 1-2 tablets (4-8 mg total) by mouth every 8 (eight) hours as needed for nausea or vomiting. Patient not taking: Reported on 02/13/2017 07/05/16   Rockne MenghiniNorman, Anne-Caroline, MD     Social History: She  reports that she has quit smoking. She has never used smokeless tobacco. She reports that she does not drink alcohol or use drugs.  Family History: family history is not on file. no history of gyn cancers  Review of Systems: A full review of systems was performed and negative  except as noted in the HPI.     O:  BP (!) 143/94 (BP Location: Left Arm)   Pulse 80   Temp 98.3 F (36.8 C) (Oral)   Resp 18   Ht 5\' 6"  (1.676 m)   Wt 209 lb (94.8 kg)   LMP 05/23/2016   BMI 33.73 kg/m  No results found for this or any previous visit (from the past 48 hour(s)).   Constitutional: NAD, AAOx3  HE/ENT: extraocular movements grossly intact, moist mucous membranes CV: RRR PULM: nl respiratory effort, CTABL     Abd: gravid, Mild to palp with UC     Ext: Non-tender, 1+ edema    Psych: mood appropriate, speech normal Pelvic: 3/50%/vtx-2  NST: Reactive  Baseline: 140, accels to 180  Variability: moderate Accelerations present x >2 Decelerations absent Time 20mins    A/P: 31 y.o. 2452w0d here for IUP at 38 weeks and B-H's this pm  Labor: not present.   Fetal Wellbeing: Reassuring Cat 1 tracing.  Reactive NST   D/c home stable, precautions reviewed, follow-up as scheduled.   ----- Nilsa Nuttingaron W. Jone,s RN, MSN, CNM, FNP [facility], Department of OB/GYN [facility]

## 2017-02-13 NOTE — OB Triage Note (Signed)
Patient came in for observation for labor evaluation. Patient reports irregular uterine contractions since lunch time at work. Patient states she vomited this morning around 0600 before work.  Patient denies  leaking of fluid, denies vaginal bleeding and spotting. Patient states she has been feeling the baby move fine. Vital signs stable and patient afebrile. FHR baseline 145 with moderate variability with accelerations 15 x 15 and no decelerations. Husband at bedside. Will continue to monitor.

## 2017-02-17 ENCOUNTER — Encounter: Payer: Self-pay | Admitting: *Deleted

## 2017-02-17 ENCOUNTER — Inpatient Hospital Stay: Payer: 59 | Admitting: Anesthesiology

## 2017-02-17 ENCOUNTER — Inpatient Hospital Stay
Admission: EM | Admit: 2017-02-17 | Discharge: 2017-02-19 | DRG: 775 | Disposition: A | Discharge status: Home / Self Care | Payer: 59 | Attending: Obstetrics and Gynecology | Admitting: Obstetrics and Gynecology

## 2017-02-17 DIAGNOSIS — O9902 Anemia complicating childbirth: Secondary | ICD-10-CM | POA: Diagnosis present

## 2017-02-17 DIAGNOSIS — O99824 Streptococcus B carrier state complicating childbirth: Secondary | ICD-10-CM | POA: Diagnosis present

## 2017-02-17 DIAGNOSIS — Z87891 Personal history of nicotine dependence: Secondary | ICD-10-CM

## 2017-02-17 DIAGNOSIS — O99344 Other mental disorders complicating childbirth: Secondary | ICD-10-CM | POA: Diagnosis present

## 2017-02-17 DIAGNOSIS — Z3A38 38 weeks gestation of pregnancy: Secondary | ICD-10-CM | POA: Diagnosis not present

## 2017-02-17 DIAGNOSIS — D649 Anemia, unspecified: Secondary | ICD-10-CM | POA: Diagnosis present

## 2017-02-17 DIAGNOSIS — Z3493 Encounter for supervision of normal pregnancy, unspecified, third trimester: Secondary | ICD-10-CM | POA: Diagnosis present

## 2017-02-17 DIAGNOSIS — F419 Anxiety disorder, unspecified: Secondary | ICD-10-CM | POA: Diagnosis present

## 2017-02-17 LAB — CBC
HCT: 31.6 % — ABNORMAL LOW (ref 35.0–47.0)
HEMOGLOBIN: 10.5 g/dL — AB (ref 12.0–16.0)
MCH: 27.3 pg (ref 26.0–34.0)
MCHC: 33.3 g/dL (ref 32.0–36.0)
MCV: 81.9 fL (ref 80.0–100.0)
Platelets: 279 10*3/uL (ref 150–440)
RBC: 3.85 MIL/uL (ref 3.80–5.20)
RDW: 14.4 % (ref 11.5–14.5)
WBC: 10.1 10*3/uL (ref 3.6–11.0)

## 2017-02-17 LAB — TYPE AND SCREEN
ABO/RH(D): O POS
Antibody Screen: NEGATIVE

## 2017-02-17 LAB — RAPID HIV SCREEN (HIV 1/2 AB+AG)
HIV 1/2 ANTIBODIES: NONREACTIVE
HIV-1 P24 ANTIGEN - HIV24: NONREACTIVE

## 2017-02-17 MED ORDER — LIDOCAINE HCL (PF) 1 % IJ SOLN
INTRAMUSCULAR | Status: AC
  Filled 2017-02-17: qty 30

## 2017-02-17 MED ORDER — EPHEDRINE SULFATE 50 MG/ML IJ SOLN
INTRAMUSCULAR | Status: AC
  Filled 2017-02-17: qty 1

## 2017-02-17 MED ORDER — LACTATED RINGERS IV SOLN
INTRAVENOUS | Status: DC
  Administered 2017-02-17 (×2): via INTRAVENOUS

## 2017-02-17 MED ORDER — BUTALBITAL-APAP-CAFFEINE 50-325-40 MG PO TABS: 1.0000 | ORAL_TABLET | Freq: Four times a day (QID) | ORAL | Status: DC | PRN

## 2017-02-17 MED ORDER — LACTATED RINGERS IV SOLN
500.0000 mL | INTRAVENOUS | Status: DC | PRN
  Administered 2017-02-17 (×2): 500 mL via INTRAVENOUS

## 2017-02-17 MED ORDER — FENTANYL 2.5 MCG/ML W/ROPIVACAINE 0.15% IN NS 100 ML EPIDURAL (ARMC)
EPIDURAL | Status: DC | PRN
  Administered 2017-02-17: 12 mL/h via EPIDURAL

## 2017-02-17 MED ORDER — SOD CITRATE-CITRIC ACID 500-334 MG/5ML PO SOLN
30.0000 mL | ORAL | Status: DC | PRN
  Filled 2017-02-17: qty 30

## 2017-02-17 MED ORDER — ONDANSETRON HCL 4 MG/2ML IJ SOLN
4.0000 mg | Freq: Four times a day (QID) | INTRAMUSCULAR | Status: DC | PRN
  Administered 2017-02-17: 4 mg via INTRAVENOUS
  Filled 2017-02-17: qty 2

## 2017-02-17 MED ORDER — LIDOCAINE HCL (PF) 2 % IJ SOLN
INTRAMUSCULAR | Status: DC | PRN
  Administered 2017-02-17: 4 mL via INTRADERMAL

## 2017-02-17 MED ORDER — OXYTOCIN 40 UNITS IN LACTATED RINGERS INFUSION - SIMPLE MED
2.5000 [IU]/h | INTRAVENOUS | Status: DC
  Administered 2017-02-18: 2.5 [IU]/h via INTRAVENOUS
  Filled 2017-02-17: qty 1000

## 2017-02-17 MED ORDER — SODIUM CHLORIDE 0.9 % IV SOLN
2.0000 g | Freq: Once | INTRAVENOUS | Status: AC
  Administered 2017-02-17: 2 g via INTRAVENOUS

## 2017-02-17 MED ORDER — LIDOCAINE-EPINEPHRINE (PF) 1.5 %-1:200000 IJ SOLN
INTRAMUSCULAR | Status: DC | PRN
  Administered 2017-02-17: 3 mL via PERINEURAL

## 2017-02-17 MED ORDER — SODIUM CHLORIDE 0.9 % IV SOLN
1.0000 g | INTRAVENOUS | Status: DC
  Administered 2017-02-17: 1 g via INTRAVENOUS
  Filled 2017-02-17 (×7): qty 1000

## 2017-02-17 MED ORDER — LIDOCAINE HCL (PF) 1 % IJ SOLN: 30.0000 mL | INTRAMUSCULAR | Status: DC | PRN

## 2017-02-17 MED ORDER — ZOLPIDEM TARTRATE 5 MG PO TABS: 5.0000 mg | ORAL_TABLET | Freq: Every evening | ORAL | Status: DC | PRN

## 2017-02-17 MED ORDER — ACETAMINOPHEN 325 MG PO TABS: 650.0000 mg | ORAL_TABLET | ORAL | Status: DC | PRN

## 2017-02-17 MED ORDER — LIDOCAINE HCL (PF) 1 % IJ SOLN
INTRAMUSCULAR | Status: DC | PRN
  Administered 2017-02-17: 3 mL

## 2017-02-17 MED ORDER — BUTORPHANOL TARTRATE 1 MG/ML IJ SOLN: 1.0000 mg | INTRAMUSCULAR | Status: DC | PRN

## 2017-02-17 MED ORDER — MISOPROSTOL 200 MCG PO TABS
ORAL_TABLET | ORAL | Status: AC
  Filled 2017-02-17: qty 4

## 2017-02-17 MED ORDER — ALPRAZOLAM 0.5 MG PO TABS: 0.5000 mg | ORAL_TABLET | Freq: Every evening | ORAL | Status: DC | PRN

## 2017-02-17 MED ORDER — SODIUM CHLORIDE 0.9 % IV SOLN
INTRAVENOUS | Status: AC
  Administered 2017-02-17: 2 g via INTRAVENOUS
  Filled 2017-02-17: qty 2000

## 2017-02-17 MED ORDER — BUPIVACAINE HCL (PF) 0.25 % IJ SOLN
INTRAMUSCULAR | Status: DC | PRN
  Administered 2017-02-17: 10 mL via EPIDURAL

## 2017-02-17 MED ORDER — OXYTOCIN 40 UNITS IN LACTATED RINGERS INFUSION - SIMPLE MED
1.0000 m[IU]/min | INTRAVENOUS | Status: DC
  Administered 2017-02-17: 2 m[IU]/min via INTRAVENOUS
  Filled 2017-02-17: qty 1000

## 2017-02-17 MED ORDER — LAMOTRIGINE 100 MG PO TABS
100.0000 mg | ORAL_TABLET | Freq: Every day | ORAL | Status: DC
  Administered 2017-02-18 – 2017-02-19 (×2): 100 mg via ORAL
  Filled 2017-02-17 (×4): qty 1

## 2017-02-17 MED ORDER — TERBUTALINE SULFATE 1 MG/ML IJ SOLN: 0.2500 mg | Freq: Once | INTRAMUSCULAR | Status: DC | PRN

## 2017-02-17 MED ORDER — AMMONIA AROMATIC IN INHA
RESPIRATORY_TRACT | Status: AC
  Filled 2017-02-17: qty 10

## 2017-02-17 MED ORDER — FENTANYL 2.5 MCG/ML W/ROPIVACAINE 0.15% IN NS 100 ML EPIDURAL (ARMC)
EPIDURAL | Status: AC
  Filled 2017-02-17: qty 100

## 2017-02-17 MED ORDER — OXYTOCIN BOLUS FROM INFUSION
500.0000 mL | Freq: Once | INTRAVENOUS | Status: AC
  Administered 2017-02-18: 500 mL via INTRAVENOUS

## 2017-02-17 NOTE — Progress Notes (Signed)
[name] CottonRebekkah L Crosby is a 31 y.o. G3P1011 at 8260w4d in active labor, augmentation with pitocin  Subjective: S/p epidural, comfortable, sleepy  Objective: BP 119/68   Pulse 79   Temp 98.5 F (36.9 C) (Oral)   Resp 18   Ht 5\' 6"  (1.676 m)   Wt 94.8 kg (209 lb)   LMP 05/23/2016   SpO2 100%   BMI 33.73 kg/m  I/O last 3 completed shifts: In: 550.3 [I.V.:550.3] Out: -  No intake/output data recorded.  FHT:  FHR: 145 bpm, variability: moderate,  accelerations:  Present,  decelerations:  Absent UC:   regular, every 2-3 minutes SVE:   Dilation: 5 Effacement (%): 60 Station: -3 Exam by:: [name] GarnetBeasley MD  Labs: Lab Results  Component Value Date   WBC 10.1 02/17/2017   HGB 10.5 (L) 02/17/2017   HCT 31.6 (L) 02/17/2017   MCV 81.9 02/17/2017   PLT 279 02/17/2017    Assessment / Plan: Spontaneous labor, progressing normally AROM for clear fluid  - Cat I strip - Anticipate vaginal delivery - GBS pos: s/p 2 dose abx  [name] DouglasBethany Machele Crosby 02/17/2017, 9:16 PM

## 2017-02-17 NOTE — Anesthesia Procedure Notes (Signed)
Epidural Patient location during procedure: OB Start time: 02/17/2017 7:56 PM  Staffing Anesthesiologist: Yves DillARROLL, Aryaan Persichetti Performed: anesthesiologist   Preanesthetic Checklist Completed: patient identified, site marked, surgical consent, pre-op evaluation, timeout performed, IV checked, risks and benefits discussed and monitors and equipment checked  Epidural Patient position: sitting Prep: Betadine Patient monitoring: heart rate, continuous pulse ox and blood pressure Approach: midline Location: L3-L4 Injection technique: LOR air  Needle:  Needle type: Tuohy  Needle gauge: 18 G Needle length: 9 cm and 9 Catheter type: closed end flexible Catheter size: 20 Guage Test dose: negative and 1.5% lidocaine with Epi 1:200 K  Assessment Events: blood not aspirated, injection not painful, no injection resistance, negative IV test and no paresthesia  Additional Notes Time out called.  Patient placed in sitting position.  Back prepped and draped in sterile fashion.  A skin wheal was made in the L3-L4 interspace with 1% Lidocaine plain.  An 18G Tuohy needle was advaced to the epidural space by a loss of resistance technique.  No blood or paresthesias.  The epidural catheter was threaded 3 cm with a negative TD .  Easy X1 and the patient tolerated the procedure well.Reason for block:procedure for pain

## 2017-02-17 NOTE — Anesthesia Preprocedure Evaluation (Signed)
Anesthesia Evaluation  Patient identified by MRN, date of birth, ID band Patient awake    Reviewed: Allergy & Precautions, NPO status , Patient's Chart, lab work & pertinent test results  Airway Mallampati: II  TM Distance: >3 FB     Dental no notable dental hx.    Pulmonary former smoker,    Pulmonary exam normal        Cardiovascular negative cardio ROS Normal cardiovascular exam     Neuro/Psych PSYCHIATRIC DISORDERS Anxiety Depression negative neurological ROS     GI/Hepatic negative GI ROS, Neg liver ROS,   Endo/Other  negative endocrine ROS  Renal/GU negative Renal ROS  negative genitourinary   Musculoskeletal negative musculoskeletal ROS (+)   Abdominal Normal abdominal exam  (+)   Peds negative pediatric ROS (+)  Hematology negative hematology ROS (+)   Anesthesia Other Findings   Reproductive/Obstetrics (+) Pregnancy                             Anesthesia Physical Anesthesia Plan  ASA: II  Anesthesia Plan: Epidural   Post-op Pain Management:    Induction:   PONV Risk Score and Plan:   Airway Management Planned: Natural Airway  Additional Equipment:   Intra-op Plan:   Post-operative Plan:   Informed Consent:   Dental advisory given  Plan Discussed with: CRNA and Surgeon  Anesthesia Plan Comments:         Anesthesia Quick Evaluation

## 2017-02-17 NOTE — H&P (Signed)
OB ADMISSION/ HISTORY & PHYSICAL:  Admission Date: 02/17/2017  1:28 PM  Admit Diagnosis: Active labor  [name] Crosby is a 31 y.o. female presenting for contractions.  Prenatal History: G3P1011   EDC : 02/27/2017, by Last Menstrual Period  [facility] Provider: Dalbert GarnetBeasley Prenatal course complicated by  - GBS positive - Lamictal exposure in midtrimester - Anemia  Prenatal Labs: ABO, Rh:  o pos,  Antibody:  neg Rubella:   immune RPR:   nonreactive HBsAg:   neg HIV:   neg GTT: 128 GBS:   positive  Medical / Surgical History :  Past medical history:  Past Medical History:  Diagnosis Date  . Anxiety   . Depression      Past surgical history:  Past Surgical History:  Procedure Laterality Date  . CHOLECYSTECTOMY      Family History: History reviewed. No pertinent family history.   Social History:  reports that she has quit smoking. She has never used smokeless tobacco. She reports that she does not drink alcohol or use drugs.   Allergies: Patient has no known allergies.    Current Medications at time of admission:  Prior to Admission medications   Medication Sig Start Date End Date Taking? Authorizing Provider  ALPRAZolam Prudy Feeler(XANAX) 0.5 MG tablet Take 0.5 mg by mouth at bedtime as needed for anxiety.   Yes [provider]  butalbital-acetaminophen-caffeine (FIORICET, ESGIC) 50-325-40 MG tablet Take by mouth 2 (two) times daily as needed for headache.   Yes [provider]  lamoTRIgine (LAMICTAL) 100 MG tablet Take 100 mg by mouth daily.   Yes [provider]  Prenatal Vit-Fe Fumarate-FA (MULTIVITAMIN-PRENATAL) 27-0.8 MG TABS tablet Take 1 tablet by mouth daily at 12 noon.   Yes [provider]  zolpidem (AMBIEN) 5 MG tablet Take 5 mg by mouth at bedtime as needed for sleep.   Yes [provider]  ondansetron (ZOFRAN ODT) 4 MG disintegrating tablet Take 1-2 tablets (4-8 mg total) by mouth every  8 (eight) hours as needed for nausea or vomiting. Patient not taking: Reported on 02/13/2017 07/05/16   Rockne MenghiniNorman, Anne-Caroline, MD     Review of Systems: Active FM onset of ctx @ 0800 currently every 3-5 minutes bloody show + Cervix 4cm  Physical Exam:  VS: Last menstrual period 05/23/2016.  General: alert and oriented, appears NAD Heart: RRR Lungs: Clear lung fields Abdomen: Gravid, soft and non-tender, non-distended Extremities: trace edema  Genitalia / VE:  4/60/-2  FHR: baseline rate 145 / variability mod / accelerations + / no decelerations TOCO: q2-4 min  Last US normal anatomy  Assessment: 520w4d weeks gestation first stage of labor FHR category I  Plan:   Admit for active labor Labs pending Epidural when desired Continuous fetal monitoring  1. Fetal Well being  - Fetal Tracing: Cat I - Ultrasound:  reviewed, as above - Group B Streptococcus: pos: start abx - Presentation: vtx confirmed by sutures   2. Routine OB: - Prenatal labs reviewed, as above - Rh positive (O positive)  3. Post Partum Planning: - Infant feeding: breast - Contraception: IUD

## 2017-02-18 ENCOUNTER — Encounter: Payer: Self-pay | Admitting: *Deleted

## 2017-02-18 LAB — RPR: RPR Ser Ql: NONREACTIVE

## 2017-02-18 LAB — CBC
HEMATOCRIT: 29.8 % — AB (ref 35.0–47.0)
HEMOGLOBIN: 10 g/dL — AB (ref 12.0–16.0)
MCH: 26.9 pg (ref 26.0–34.0)
MCHC: 33.5 g/dL (ref 32.0–36.0)
MCV: 80.4 fL (ref 80.0–100.0)
Platelets: 258 10*3/uL (ref 150–440)
RBC: 3.7 MIL/uL — ABNORMAL LOW (ref 3.80–5.20)
RDW: 14.3 % (ref 11.5–14.5)
WBC: 14.6 10*3/uL — ABNORMAL HIGH (ref 3.6–11.0)

## 2017-02-18 MED ORDER — PRENATAL MULTIVITAMIN CH
1.0000 | ORAL_TABLET | Freq: Every day | ORAL | Status: DC
  Administered 2017-02-18 – 2017-02-19 (×2): 1 via ORAL
  Filled 2017-02-18 (×2): qty 1

## 2017-02-18 MED ORDER — WITCH HAZEL-GLYCERIN EX PADS: 1.0000 "application " | MEDICATED_PAD | CUTANEOUS | Status: DC | PRN

## 2017-02-18 MED ORDER — BENZOCAINE-MENTHOL 20-0.5 % EX AERO: 1.0000 "application " | INHALATION_SPRAY | CUTANEOUS | Status: DC | PRN

## 2017-02-18 MED ORDER — SODIUM CHLORIDE 0.9% FLUSH: 3.0000 mL | Freq: Two times a day (BID) | INTRAVENOUS | Status: DC

## 2017-02-18 MED ORDER — IBUPROFEN 600 MG PO TABS
600.0000 mg | ORAL_TABLET | Freq: Four times a day (QID) | ORAL | Status: DC
  Administered 2017-02-18 – 2017-02-19 (×4): 600 mg via ORAL
  Filled 2017-02-18 (×4): qty 1

## 2017-02-18 MED ORDER — EPHEDRINE 5 MG/ML INJ: 10.0000 mg | INTRAVENOUS | Status: DC | PRN

## 2017-02-18 MED ORDER — SODIUM CHLORIDE 0.9% FLUSH: 3.0000 mL | INTRAVENOUS | Status: DC | PRN

## 2017-02-18 MED ORDER — IBUPROFEN 600 MG PO TABS
ORAL_TABLET | ORAL | Status: AC
  Administered 2017-02-18: 600 mg via ORAL
  Filled 2017-02-18: qty 1

## 2017-02-18 MED ORDER — IBUPROFEN 600 MG PO TABS
600.0000 mg | ORAL_TABLET | Freq: Four times a day (QID) | ORAL | Status: DC
  Administered 2017-02-18: 600 mg via ORAL

## 2017-02-18 MED ORDER — FLEET ENEMA 7-19 GM/118ML RE ENEM: 1.0000 | ENEMA | Freq: Every day | RECTAL | Status: DC | PRN

## 2017-02-18 MED ORDER — DIBUCAINE 1 % RE OINT: 1.0000 "application " | TOPICAL_OINTMENT | RECTAL | Status: DC | PRN

## 2017-02-18 MED ORDER — DIPHENHYDRAMINE HCL 50 MG/ML IJ SOLN: 12.5000 mg | INTRAMUSCULAR | Status: DC | PRN

## 2017-02-18 MED ORDER — COCONUT OIL OIL
1.0000 "application " | TOPICAL_OIL | Status: DC | PRN
  Administered 2017-02-19: 1 via TOPICAL
  Filled 2017-02-18: qty 120

## 2017-02-18 MED ORDER — LACTATED RINGERS IV SOLN: 500.0000 mL | Freq: Once | INTRAVENOUS | Status: DC

## 2017-02-18 MED ORDER — SIMETHICONE 80 MG PO CHEW: 80.0000 mg | CHEWABLE_TABLET | ORAL | Status: DC | PRN

## 2017-02-18 MED ORDER — PHENYLEPHRINE 40 MCG/ML (10ML) SYRINGE FOR IV PUSH (FOR BLOOD PRESSURE SUPPORT): 80.0000 ug | PREFILLED_SYRINGE | INTRAVENOUS | Status: DC | PRN

## 2017-02-18 MED ORDER — MEASLES, MUMPS & RUBELLA VAC ~~LOC~~ INJ
0.5000 mL | INJECTION | Freq: Once | SUBCUTANEOUS | Status: DC
Start: 2017-02-19 — End: 2017-02-19
  Filled 2017-02-18: qty 0.5

## 2017-02-18 MED ORDER — SENNOSIDES-DOCUSATE SODIUM 8.6-50 MG PO TABS
2.0000 | ORAL_TABLET | ORAL | Status: DC
  Administered 2017-02-19: 2 via ORAL
  Filled 2017-02-18: qty 2

## 2017-02-18 MED ORDER — ONDANSETRON HCL 4 MG/2ML IJ SOLN: 4.0000 mg | INTRAMUSCULAR | Status: DC | PRN

## 2017-02-18 MED ORDER — ONDANSETRON HCL 4 MG PO TABS: 4.0000 mg | ORAL_TABLET | ORAL | Status: DC | PRN

## 2017-02-18 MED ORDER — FAMOTIDINE 20 MG PO TABS
20.0000 mg | ORAL_TABLET | Freq: Two times a day (BID) | ORAL | Status: DC
  Administered 2017-02-18 – 2017-02-19 (×3): 20 mg via ORAL
  Filled 2017-02-18 (×3): qty 1

## 2017-02-18 MED ORDER — IBUPROFEN 600 MG PO TABS
600.0000 mg | ORAL_TABLET | Freq: Four times a day (QID) | ORAL | Status: DC
  Administered 2017-02-18: 600 mg via ORAL
  Filled 2017-02-18: qty 1

## 2017-02-18 MED ORDER — ACETAMINOPHEN 325 MG PO TABS: 650.0000 mg | ORAL_TABLET | ORAL | Status: DC | PRN

## 2017-02-18 MED ORDER — SODIUM CHLORIDE 0.9 % IV SOLN: 250.0000 mL | INTRAVENOUS | Status: DC | PRN

## 2017-02-18 MED ORDER — TETANUS-DIPHTH-ACELL PERTUSSIS 5-2.5-18.5 LF-MCG/0.5 IM SUSP: 0.5000 mL | Freq: Once | INTRAMUSCULAR | Status: DC

## 2017-02-18 MED ORDER — BISACODYL 10 MG RE SUPP: 10.0000 mg | Freq: Every day | RECTAL | Status: DC | PRN

## 2017-02-18 MED ORDER — FENTANYL 2.5 MCG/ML W/ROPIVACAINE 0.15% IN NS 100 ML EPIDURAL (ARMC): 12.0000 mL/h | EPIDURAL | Status: DC

## 2017-02-18 MED ORDER — DIPHENHYDRAMINE HCL 25 MG PO CAPS: 25.0000 mg | ORAL_CAPSULE | Freq: Four times a day (QID) | ORAL | Status: DC | PRN

## 2017-02-18 NOTE — Progress Notes (Signed)
Post Partum Day 1 Subjective: no complaints; straight cath for 720mL urine 4 hours after delivery. Has not voided yet.  Objective: Blood pressure 140/81, pulse 89, temperature 98.7 F (37.1 C), temperature source Oral, resp. rate 18, height 5\' 6"  (1.676 m), weight 94.8 kg (209 lb), last menstrual period 05/23/2016, SpO2 98 %, unknown if currently breastfeeding.  Physical Exam:  General: alert, cooperative and no distress Lochia: appropriate Uterine Fundus: firm DVT Evaluation: No evidence of DVT seen on physical exam.   Recent Labs  02/17/17 1356  HGB 10.5*  HCT 31.6*    Assessment/Plan: Plan for discharge tomorrow, Breastfeeding and Lactation consult  - bladder scan now. If no void after 2 straight cath, place Foley   LOS: 1 day   Christeen DouglasBethany Maridel Pixler 02/18/2017, 8:26 AM

## 2017-02-18 NOTE — Discharge Summary (Signed)
Obstetrical Discharge Summary  Patient Name: [name] CottonRebekkah L Allcorn DOB: 09/13/1985 MRN: [id number]  Date of Admission: 02/17/2017 Date of Discharge: 02/19/2017  [facility]   Gestational Age at Delivery: 6354w5d   Antepartum complications:  - GBS positive - Lamictal exposure in midtrimester - Anemia Admitting Diagnosis:  - active labor at term Secondary Diagnosis: Patient Active Problem List   Diagnosis Date Noted  . Supervision of normal pregnancy in third trimester 02/17/2017  . First trimester screening     Augmentation: AROM and Pitocin Complications: None Intrapartum complications/course:  30yo G2P1001 at 38+5wks presenting in active labor. Received amp for GBS ppx, Augmentation with pitocin and AROM for clear fluid. She entered the second stage of labor and pushed well over an intact perineum, delivering the fetal head and right arm in a single contraction. Anterior arm followed immediately. No nuchal cord. Vigorous and crying baby was placed on maternal abdomen; delayed cord clamping. Father cut the cord. The placenta delivered and was intact. A small laceration was repaired with a single stitch and excellent hemostasis was noted. The fundus was firm. Postpartum pitocin was started. Baby was placed skin to skin. Bleeding was minimal.  Date of Delivery: 02/18/17 Delivered By: Christeen DouglasBethany Beasley Delivery Type: spontaneous vaginal delivery Anesthesia: epidural Placenta: Spontaneous Laceration: small vaginal Episiotomy: none Newborn Data: Live born female "Lilly" Birth Weight:   APGAR: 9, 9    Discharge Physical Exam:  BP 119/78 (BP Location: Right Arm)   Pulse 67   Temp 98.1 F (36.7 C) (Oral)   Resp 17   Ht 5\' 6"  (1.676 m)   Wt 94.8 kg (209 lb)   LMP 05/23/2016   SpO2 99%   Breastfeeding yes  BMI 33.73 kg/m   General: NAD CV: RRR Pulm: CTABL, nl effort ABD: s/nd/nt, fundus firm and below the umbilicus Lochia: moderate DVT Evaluation: LE  non-ttp, no evidence of DVT on exam.  Hemoglobin  Date Value Ref Range Status  02/18/2017 10.0 (L) 12.0 - 16.0 g/dL Final   HGB  Date Value Ref Range Status  09/25/2013 12.4 12.0 - 16.0 g/dL Final   HCT  Date Value Ref Range Status  02/18/2017 29.8 (L) 35.0 - 47.0 % Final  09/25/2013 38.8 35.0 - 47.0 % Final    Post partum course: routine Postpartum Procedures: none Disposition: stable, discharge to home. Baby Feeding: breastmilk Baby Disposition: home with mom  Rh Immune globulin given: n/a Rubella vaccine given: n/a Tdap vaccine given in AP or PP setting: 01/09/17 Flu vaccine given in AP or PP setting: 04/2016  Contraception: IUD  Prenatal Labs:  ABO, Rh:  o pos,  Antibody:  neg Rubella:   immune RPR:   nonreactive HBsAg:   neg HIV:   neg GTT: 128 GBS:   positive   Plan:  Anhthu L Veazey was discharged to home in good condition. [facility]   Discharge Medications: Allergies as of 02/19/2017   No Known Allergies     Medication List    TAKE these medications   ALPRAZolam 0.5 MG tablet Commonly known as:  XANAX Take 0.5 mg by mouth at bedtime as needed for anxiety.   butalbital-acetaminophen-caffeine 50-325-40 MG tablet Commonly known as:  FIORICET, ESGIC Take by mouth 2 (two) times daily as needed for headache.   lamoTRIgine 100 MG tablet Commonly known as:  LAMICTAL Take 100 mg by mouth daily.   multivitamin-prenatal 27-0.8 MG Tabs tablet Take 1 tablet by mouth  daily at 12 noon.   ondansetron 4 MG disintegrating tablet Commonly known as:  ZOFRAN ODT Take 1-2 tablets (4-8 mg total) by mouth every 8 (eight) hours as needed for nausea or vomiting.   zolpidem 5 MG tablet Commonly known as:  AMBIEN Take 5 mg by mouth at bedtime as needed for sleep.       Follow-up Information    [name], MD Follow up in 6 week(s).   Specialty:  Obstetrics and Gynecology Contact information: [address] [place] [address] [phone]           Signed: ----- Ranae Plumber, MD Attending Obstetrician and Gynecologist [facility], Department of OB/GYN [facility]

## 2017-02-18 NOTE — Progress Notes (Signed)
Patient arrived on unit at approximately 0300. Called out to nursing station stating that she was very uncomfortable and needed to try to void because she was unable to do so in BP. Attempted to get patient up to void at this time. Unable to void. Attempted ambulating, fundal massage and running water with no success. Bladder scan performed which reported patient had >63568ml of urine in bladder. Straight cath performed and 725mls of urine drained. Patient reports feeling much better at this time.

## 2017-02-19 NOTE — Discharge Instructions (Signed)

## 2017-02-19 NOTE — Progress Notes (Signed)
D/C instructions provided, pt states understanding, aware of follow up appt.      

## 2017-02-19 NOTE — Progress Notes (Signed)
Pt and family viewing Period of Purple Cry.

## 2017-02-19 NOTE — Progress Notes (Signed)
D/C home to car via auxiliary in wheelchair.  

## 2017-02-21 NOTE — Anesthesia Postprocedure Evaluation (Signed)
Anesthesia Post Note  Patient: [name] Crosby  Procedure(s) Performed: * No procedures listed *  Patient location during evaluation: Other Anesthesia Type: Epidural Level of consciousness: awake and alert and oriented Pain management: pain level controlled Vital Signs Assessment: post-procedure vital signs reviewed and stable Respiratory status: spontaneous breathing Cardiovascular status: blood pressure returned to baseline Anesthetic complications: no Comments: No apparent anesthesia issues.  Patient d/ced prior to visit.  Chart was reviewed     Last Vitals: There were no vitals filed for this visit.  Last Pain: There were no vitals filed for this visit.               Vanderbilt Ranieri

## 2017-07-13 ENCOUNTER — Other Ambulatory Visit: Payer: Self-pay | Admitting: Orthopedic Surgery

## 2017-07-13 DIAGNOSIS — M25552 Pain in left hip: Secondary | ICD-10-CM

## 2017-07-21 ENCOUNTER — Ambulatory Visit
Admission: RE | Admit: 2017-07-21 | Discharge: 2017-07-21 | Disposition: A | Discharge status: Home / Self Care | Payer: 59 | Source: Ambulatory Visit | Attending: Orthopedic Surgery | Admitting: Orthopedic Surgery

## 2017-07-21 ENCOUNTER — Other Ambulatory Visit: Payer: Self-pay | Admitting: Orthopedic Surgery

## 2017-07-21 DIAGNOSIS — M25552 Pain in left hip: Secondary | ICD-10-CM

## 2017-07-21 MED ORDER — GADOBENATE DIMEGLUMINE 529 MG/ML IV SOLN
0.0100 mL | Freq: Once | INTRAVENOUS | Status: AC | PRN
  Administered 2017-07-21: 0.01 mL via INTRA_ARTICULAR

## 2017-07-21 MED ORDER — IOPAMIDOL (ISOVUE-200) INJECTION 41%
15.0000 mL | Freq: Once | INTRAVENOUS | Status: AC | PRN
  Administered 2017-07-21: 15 mL
  Filled 2017-07-21: qty 50

## 2017-07-21 MED ORDER — LIDOCAINE HCL (PF) 1 % IJ SOLN
10.0000 mL | Freq: Once | INTRAMUSCULAR | Status: DC
  Filled 2017-07-21: qty 10

## 2018-10-23 ENCOUNTER — Other Ambulatory Visit: Payer: Self-pay | Admitting: Surgery

## 2018-10-23 DIAGNOSIS — M25532 Pain in left wrist: Secondary | ICD-10-CM

## 2018-10-23 DIAGNOSIS — S63502A Unspecified sprain of left wrist, initial encounter: Secondary | ICD-10-CM

## 2018-10-26 ENCOUNTER — Ambulatory Visit
Admission: RE | Admit: 2018-10-26 | Discharge: 2018-10-26 | Disposition: A | Discharge status: Home / Self Care | Payer: Managed Care, Other (non HMO) | Source: Ambulatory Visit | Attending: Surgery | Admitting: Surgery

## 2018-10-26 DIAGNOSIS — M25532 Pain in left wrist: Secondary | ICD-10-CM

## 2018-10-26 DIAGNOSIS — S63502A Unspecified sprain of left wrist, initial encounter: Secondary | ICD-10-CM

## 2019-11-12 ENCOUNTER — Other Ambulatory Visit: Payer: Self-pay | Admitting: Orthopedic Surgery

## 2019-11-13 ENCOUNTER — Other Ambulatory Visit: Payer: Self-pay | Admitting: Orthopedic Surgery

## 2019-11-13 ENCOUNTER — Other Ambulatory Visit (HOSPITAL_COMMUNITY): Payer: Self-pay | Admitting: Orthopedic Surgery

## 2019-11-13 DIAGNOSIS — M25512 Pain in left shoulder: Secondary | ICD-10-CM

## 2019-11-25 ENCOUNTER — Other Ambulatory Visit: Payer: Self-pay | Admitting: Orthopedic Surgery

## 2019-11-25 DIAGNOSIS — M25512 Pain in left shoulder: Secondary | ICD-10-CM

## 2019-11-26 ENCOUNTER — Other Ambulatory Visit: Payer: Self-pay

## 2019-11-26 ENCOUNTER — Ambulatory Visit
Admission: RE | Admit: 2019-11-26 | Discharge: 2019-11-26 | Disposition: A | Discharge status: Home / Self Care | Payer: Managed Care, Other (non HMO) | Source: Ambulatory Visit | Attending: Orthopedic Surgery | Admitting: Orthopedic Surgery

## 2019-11-26 DIAGNOSIS — M25512 Pain in left shoulder: Secondary | ICD-10-CM | POA: Diagnosis not present

## 2019-11-26 DIAGNOSIS — R936 Abnormal findings on diagnostic imaging of limbs: Secondary | ICD-10-CM | POA: Insufficient documentation

## 2019-11-26 MED ORDER — GADOBUTROL 1 MMOL/ML IV SOLN
0.0500 mL | Freq: Once | INTRAVENOUS | Status: AC | PRN
  Administered 2019-11-26: 0.05 mL

## 2019-11-26 MED ORDER — SODIUM CHLORIDE (PF) 0.9 % IJ SOLN
10.0000 mL | INTRAMUSCULAR | Status: DC | PRN
  Administered 2019-11-26: 10 mL

## 2019-11-26 MED ORDER — LIDOCAINE HCL (PF) 1 % IJ SOLN
3.0000 mL | Freq: Once | INTRAMUSCULAR | Status: AC
  Administered 2019-11-26: 10:00:00 3 mL
  Filled 2019-11-26: qty 4

## 2019-11-26 MED ORDER — IOHEXOL 180 MG/ML  SOLN
5.0000 mL | Freq: Once | INTRAMUSCULAR | Status: AC | PRN
  Administered 2019-11-26: 5 mL via INTRA_ARTICULAR

## 2019-12-03 ENCOUNTER — Other Ambulatory Visit: Payer: Self-pay | Admitting: Orthopedic Surgery

## 2019-12-04 ENCOUNTER — Encounter
Admission: RE | Admit: 2019-12-04 | Discharge: 2019-12-04 | Disposition: A | Discharge status: Home / Self Care | Payer: Managed Care, Other (non HMO) | Source: Ambulatory Visit | Attending: Orthopedic Surgery | Admitting: Orthopedic Surgery

## 2019-12-04 ENCOUNTER — Other Ambulatory Visit: Payer: Self-pay

## 2019-12-04 HISTORY — DX: Nausea with vomiting, unspecified: R11.2

## 2019-12-04 HISTORY — DX: Other specified postprocedural states: Z98.890

## 2019-12-04 HISTORY — DX: Headache, unspecified: R51.9

## 2019-12-04 NOTE — Patient Instructions (Addendum)
Your procedure is scheduled on: 12/09/19 Report to DAY SURGERY DEPARTMENT LOCATED ON 2ND FLOOR MEDICAL MALL ENTRANCE. To find out your arrival time please call [phone] between 1PM - 3PM on 12/06/19.  Remember: Instructions that are not followed completely may result in serious medical risk, up to and including death, or upon the discretion of your surgeon and anesthesiologist your surgery may need to be rescheduled.     _X__ 1. Do not eat food after midnight the night before your procedure.                 No gum chewing or hard candies. You may drink clear liquids up to 2 hours                 before you are scheduled to arrive for your surgery- DO not drink clear                 liquids within 2 hours of the start of your surgery.                 Clear Liquids include:  water, apple juice without pulp, clear carbohydrate                 drink such as Clearfast or Gatorade, Black Coffee or Tea (Do not add                 anything to coffee or tea). Diabetics water only  __X__2.  On the morning of surgery brush your teeth with toothpaste and water, you                 may rinse your mouth with mouthwash if you wish.  Do not swallow any              toothpaste of mouthwash.     _X__ 3.  No Alcohol for 24 hours before or after surgery.   _X__ 4.  Do Not Smoke or use e-cigarettes For 24 Hours Prior to Your Surgery.                 Do not use any chewable tobacco products for at least 6 hours prior to                 surgery.  ____  5.  Bring all medications with you on the day of surgery if instructed.   __X__  6.  Notify your doctor if there is any change in your medical condition      (cold, fever, infections).     Do not wear jewelry, make-up, hairpins, clips or nail polish. Do not wear lotions, powders, or perfumes.  Do not shave 48 hours prior to surgery. Men may shave face and neck. Do not bring valuables to the hospital.    [facility] is not responsible for any belongings or  valuables.  Contacts, dentures/partials or body piercings may not be worn into surgery. Bring a case for your contacts, glasses or hearing aids, a denture cup will be supplied. Leave your suitcase in the car. After surgery it may be brought to your room. For patients admitted to the hospital, discharge time is determined by your treatment team.   Patients discharged the day of surgery will not be allowed to drive home.   Please read over the following fact sheets that you were given:   MRSA Information  __X__ Take these medicines the morning of surgery with A SIP OF WATER:  1. buPROPion (WELLBUTRIN XL) 300 MG 24 hr tablet  2. lamoTRIgine (LAMICTAL  3. topiramate (TOPAMAX) 50 MG tablet  4. YOU MAY TAKE THE CLONAZEPAM IF NEEDED FOR ANXIETY  5.  6.  ____ Fleet Enema (as directed)   __X__ Use CHG Soap/SAGE wipes as directed  ____ Use inhalers on the day of surgery  ____ Stop metformin/Janumet/Farxiga 2 days prior to surgery    ____ Take 1/2 of usual insulin dose the night before surgery. No insulin the morning          of surgery.   ____ Stop Blood Thinners Coumadin/Plavix/Xarelto/Pleta/Pradaxa/Eliquis/Effient/Aspirin  on   Or contact your Surgeon, Cardiologist or Medical Doctor regarding  ability to stop your blood thinners  __X__ Stop Anti-inflammatories 7 days before surgery such as Advil, Ibuprofen, Motrin,  BC or Goodies Powder, Naprosyn, Naproxen, Aleve, Aspirin    __X__ Stop all herbal supplements, fish oil or vitamin E until after surgery.    ____ Bring C-Pap to the hospital.    ENSURE PRE SURGERY DRINK SHOULD BE COMPLETED 2 HOURS BEFORE YOUR ARRIVAL.  REVIEW INCENTIVE SPIROMETER INSTRUCTIONS

## 2019-12-05 ENCOUNTER — Other Ambulatory Visit
Admission: RE | Admit: 2019-12-05 | Discharge: 2019-12-05 | Disposition: A | Discharge status: Home / Self Care | Payer: Managed Care, Other (non HMO) | Source: Ambulatory Visit | Attending: Orthopedic Surgery | Admitting: Orthopedic Surgery

## 2019-12-05 DIAGNOSIS — Z01812 Encounter for preprocedural laboratory examination: Secondary | ICD-10-CM | POA: Insufficient documentation

## 2019-12-05 DIAGNOSIS — Z20822 Contact with and (suspected) exposure to covid-19: Secondary | ICD-10-CM | POA: Diagnosis not present

## 2019-12-05 LAB — SARS CORONAVIRUS 2 (TAT 6-24 HRS): SARS Coronavirus 2: NEGATIVE

## 2019-12-09 ENCOUNTER — Ambulatory Visit: Payer: Managed Care, Other (non HMO)

## 2019-12-09 ENCOUNTER — Other Ambulatory Visit: Payer: Self-pay

## 2019-12-09 ENCOUNTER — Ambulatory Visit: Payer: Managed Care, Other (non HMO) | Admitting: Family

## 2019-12-09 ENCOUNTER — Ambulatory Visit
Admission: RE | Admit: 2019-12-09 | Discharge: 2019-12-09 | Disposition: A | Discharge status: Home / Self Care | Payer: Managed Care, Other (non HMO) | Attending: Orthopedic Surgery | Admitting: Orthopedic Surgery

## 2019-12-09 ENCOUNTER — Encounter
Admission: RE | Disposition: A | Discharge status: Home / Self Care | Payer: Self-pay | Source: Home / Self Care | Attending: Orthopedic Surgery

## 2019-12-09 DIAGNOSIS — M25519 Pain in unspecified shoulder: Secondary | ICD-10-CM

## 2019-12-09 DIAGNOSIS — F319 Bipolar disorder, unspecified: Secondary | ICD-10-CM | POA: Insufficient documentation

## 2019-12-09 DIAGNOSIS — G43909 Migraine, unspecified, not intractable, without status migrainosus: Secondary | ICD-10-CM | POA: Insufficient documentation

## 2019-12-09 DIAGNOSIS — Z79899 Other long term (current) drug therapy: Secondary | ICD-10-CM | POA: Diagnosis not present

## 2019-12-09 DIAGNOSIS — M24412 Recurrent dislocation, left shoulder: Secondary | ICD-10-CM | POA: Insufficient documentation

## 2019-12-09 DIAGNOSIS — X58XXXA Exposure to other specified factors, initial encounter: Secondary | ICD-10-CM | POA: Diagnosis not present

## 2019-12-09 DIAGNOSIS — S42292A Other displaced fracture of upper end of left humerus, initial encounter for closed fracture: Secondary | ICD-10-CM | POA: Diagnosis not present

## 2019-12-09 DIAGNOSIS — Z87891 Personal history of nicotine dependence: Secondary | ICD-10-CM | POA: Insufficient documentation

## 2019-12-09 DIAGNOSIS — F419 Anxiety disorder, unspecified: Secondary | ICD-10-CM | POA: Diagnosis not present

## 2019-12-09 DIAGNOSIS — M25512 Pain in left shoulder: Secondary | ICD-10-CM

## 2019-12-09 HISTORY — PX: SHOULDER ARTHROSCOPY WITH LABRAL REPAIR: SHX5691

## 2019-12-09 LAB — POCT PREGNANCY, URINE: Preg Test, Ur: NEGATIVE

## 2019-12-09 SURGERY — ARTHROSCOPY, SHOULDER, WITH GLENOID LABRUM REPAIR
Anesthesia: Regional | Site: Shoulder | Laterality: Left

## 2019-12-09 MED ORDER — BUPIVACAINE HCL (PF) 0.5 % IJ SOLN
INTRAMUSCULAR | Status: AC
  Filled 2019-12-09: qty 10

## 2019-12-09 MED ORDER — OXYCODONE HCL 5 MG PO TABS
ORAL_TABLET | ORAL | Status: AC
  Administered 2019-12-09: 5 mg via ORAL
  Filled 2019-12-09: qty 1

## 2019-12-09 MED ORDER — FENTANYL CITRATE (PF) 100 MCG/2ML IJ SOLN
INTRAMUSCULAR | Status: DC | PRN
  Administered 2019-12-09 (×3): 50 ug via INTRAVENOUS
  Administered 2019-12-09 (×2): 25 ug via INTRAVENOUS

## 2019-12-09 MED ORDER — ROCURONIUM BROMIDE 100 MG/10ML IV SOLN
INTRAVENOUS | Status: DC | PRN
  Administered 2019-12-09: 20 mg via INTRAVENOUS
  Administered 2019-12-09: 50 mg via INTRAVENOUS
  Administered 2019-12-09: 20 mg via INTRAVENOUS

## 2019-12-09 MED ORDER — ASPIRIN EC 325 MG PO TBEC: 325.0000 mg | DELAYED_RELEASE_TABLET | Freq: Every day | ORAL | 0 refills | Status: AC

## 2019-12-09 MED ORDER — LIDOCAINE HCL (CARDIAC) PF 100 MG/5ML IV SOSY
PREFILLED_SYRINGE | INTRAVENOUS | Status: DC | PRN
  Administered 2019-12-09: 60 mg via INTRAVENOUS

## 2019-12-09 MED ORDER — LIDOCAINE HCL (PF) 2 % IJ SOLN
INTRAMUSCULAR | Status: AC
  Filled 2019-12-09: qty 10

## 2019-12-09 MED ORDER — ONDANSETRON HCL 4 MG/2ML IJ SOLN: 4.0000 mg | Freq: Once | INTRAMUSCULAR | Status: DC | PRN

## 2019-12-09 MED ORDER — LACTATED RINGERS IV SOLN
INTRAVENOUS | Status: DC | PRN
  Administered 2019-12-09: 4 mL

## 2019-12-09 MED ORDER — SUGAMMADEX SODIUM 200 MG/2ML IV SOLN
INTRAVENOUS | Status: DC | PRN
  Administered 2019-12-09: 100 mg via INTRAVENOUS

## 2019-12-09 MED ORDER — BUPIVACAINE HCL (PF) 0.5 % IJ SOLN
INTRAMUSCULAR | Status: DC | PRN
  Administered 2019-12-09: 10 mL via PERINEURAL

## 2019-12-09 MED ORDER — FENTANYL CITRATE (PF) 100 MCG/2ML IJ SOLN: 25.0000 ug | INTRAMUSCULAR | Status: DC | PRN

## 2019-12-09 MED ORDER — MIDAZOLAM HCL 2 MG/2ML IJ SOLN
INTRAMUSCULAR | Status: AC
  Administered 2019-12-09: 1 mg via INTRAVENOUS
  Filled 2019-12-09: qty 2

## 2019-12-09 MED ORDER — KETOROLAC TROMETHAMINE 30 MG/ML IJ SOLN
INTRAMUSCULAR | Status: DC | PRN
  Administered 2019-12-09: 30 mg via INTRAVENOUS

## 2019-12-09 MED ORDER — LIDOCAINE HCL (PF) 1 % IJ SOLN
INTRAMUSCULAR | Status: AC
  Filled 2019-12-09: qty 30

## 2019-12-09 MED ORDER — EPINEPHRINE PF 1 MG/ML IJ SOLN
INTRAMUSCULAR | Status: AC
  Filled 2019-12-09: qty 1

## 2019-12-09 MED ORDER — ONDANSETRON HCL 4 MG/2ML IJ SOLN
INTRAMUSCULAR | Status: AC
  Filled 2019-12-09: qty 2

## 2019-12-09 MED ORDER — OXYCODONE HCL 5 MG PO TABS: 5.0000 mg | ORAL_TABLET | Freq: Once | ORAL | Status: AC

## 2019-12-09 MED ORDER — PHENYLEPHRINE HCL (PRESSORS) 10 MG/ML IV SOLN
INTRAVENOUS | Status: DC | PRN
  Administered 2019-12-09: 100 ug via INTRAVENOUS

## 2019-12-09 MED ORDER — PROPOFOL 10 MG/ML IV BOLUS
INTRAVENOUS | Status: AC
  Filled 2019-12-09: qty 40

## 2019-12-09 MED ORDER — MIDAZOLAM HCL 2 MG/2ML IJ SOLN: 1.0000 mg | Freq: Once | INTRAMUSCULAR | Status: AC

## 2019-12-09 MED ORDER — FENTANYL CITRATE (PF) 100 MCG/2ML IJ SOLN: 50.0000 ug | Freq: Once | INTRAMUSCULAR | Status: AC

## 2019-12-09 MED ORDER — ONDANSETRON HCL 4 MG/2ML IJ SOLN
INTRAMUSCULAR | Status: DC | PRN
  Administered 2019-12-09: 4 mg via INTRAVENOUS

## 2019-12-09 MED ORDER — CEFAZOLIN SODIUM-DEXTROSE 2-4 GM/100ML-% IV SOLN
INTRAVENOUS | Status: AC
  Filled 2019-12-09: qty 100

## 2019-12-09 MED ORDER — SEVOFLURANE IN SOLN
RESPIRATORY_TRACT | Status: AC
  Filled 2019-12-09: qty 250

## 2019-12-09 MED ORDER — ROCURONIUM BROMIDE 10 MG/ML (PF) SYRINGE
PREFILLED_SYRINGE | INTRAVENOUS | Status: AC
  Filled 2019-12-09: qty 10

## 2019-12-09 MED ORDER — OXYCODONE HCL 5 MG PO TABS: 5.0000 mg | ORAL_TABLET | ORAL | 0 refills | Status: AC | PRN

## 2019-12-09 MED ORDER — BUPIVACAINE LIPOSOME 1.3 % IJ SUSP
INTRAMUSCULAR | Status: AC
  Filled 2019-12-09: qty 20

## 2019-12-09 MED ORDER — LIDOCAINE HCL (PF) 1 % IJ SOLN
INTRAMUSCULAR | Status: AC
  Filled 2019-12-09: qty 5

## 2019-12-09 MED ORDER — ONDANSETRON 4 MG PO TBDP: 4.0000 mg | ORAL_TABLET | Freq: Three times a day (TID) | ORAL | 0 refills | Status: AC | PRN

## 2019-12-09 MED ORDER — LACTATED RINGERS IV SOLN: INTRAVENOUS | Status: DC

## 2019-12-09 MED ORDER — DEXAMETHASONE SODIUM PHOSPHATE 10 MG/ML IJ SOLN
INTRAMUSCULAR | Status: AC
  Filled 2019-12-09: qty 1

## 2019-12-09 MED ORDER — FENTANYL CITRATE (PF) 100 MCG/2ML IJ SOLN
INTRAMUSCULAR | Status: AC
  Filled 2019-12-09: qty 2

## 2019-12-09 MED ORDER — PROPOFOL 10 MG/ML IV BOLUS
INTRAVENOUS | Status: DC | PRN
  Administered 2019-12-09: 150 mg via INTRAVENOUS

## 2019-12-09 MED ORDER — SCOPOLAMINE 1 MG/3DAYS TD PT72: 1.0000 | MEDICATED_PATCH | Freq: Once | TRANSDERMAL | Status: DC

## 2019-12-09 MED ORDER — LIDOCAINE HCL (PF) 1 % IJ SOLN
INTRAMUSCULAR | Status: DC | PRN
  Administered 2019-12-09: .8 mL via SUBCUTANEOUS

## 2019-12-09 MED ORDER — CEFAZOLIN SODIUM-DEXTROSE 2-4 GM/100ML-% IV SOLN
2.0000 g | INTRAVENOUS | Status: AC
  Administered 2019-12-09: 2 g via INTRAVENOUS

## 2019-12-09 MED ORDER — ACETAMINOPHEN 500 MG PO TABS: 1000.0000 mg | ORAL_TABLET | Freq: Three times a day (TID) | ORAL | 2 refills | Status: AC

## 2019-12-09 MED ORDER — EPINEPHRINE PF 1 MG/ML IJ SOLN
INTRAMUSCULAR | Status: AC
  Filled 2019-12-09: qty 4

## 2019-12-09 MED ORDER — DEXMEDETOMIDINE HCL IN NACL 200 MCG/50ML IV SOLN
INTRAVENOUS | Status: DC | PRN
  Administered 2019-12-09 (×2): 4 ug via INTRAVENOUS

## 2019-12-09 MED ORDER — MIDAZOLAM HCL 2 MG/2ML IJ SOLN
1.0000 mg | Freq: Once | INTRAMUSCULAR | Status: AC
  Administered 2019-12-09: 1 mg via INTRAVENOUS

## 2019-12-09 MED ORDER — FENTANYL CITRATE (PF) 100 MCG/2ML IJ SOLN
INTRAMUSCULAR | Status: AC
  Administered 2019-12-09: 50 ug via INTRAVENOUS
  Filled 2019-12-09: qty 2

## 2019-12-09 MED ORDER — DEXAMETHASONE SODIUM PHOSPHATE 10 MG/ML IJ SOLN
INTRAMUSCULAR | Status: DC | PRN
  Administered 2019-12-09: 10 mg via INTRAVENOUS

## 2019-12-09 MED ORDER — SCOPOLAMINE 1 MG/3DAYS TD PT72
MEDICATED_PATCH | TRANSDERMAL | Status: AC
  Administered 2019-12-09: 1.5 mg via TRANSDERMAL
  Filled 2019-12-09: qty 1

## 2019-12-09 MED ORDER — BUPIVACAINE LIPOSOME 1.3 % IJ SUSP
INTRAMUSCULAR | Status: DC | PRN
  Administered 2019-12-09: 20 mL via PERINEURAL

## 2019-12-09 SURGICAL SUPPLY — 63 items
ADAPTER IRRIG TUBE 2 SPIKE SOL (ADAPTER) ×4 IMPLANT
ANCHOR 3.9 PEEK 3 CORKSCREW (Anchor) ×2 IMPLANT
ANCHOR SUT 1.8 FBRTK KNTLS 2SU (Anchor) ×6 IMPLANT
ANCHOR SUT BIOCOMP LK 2.9X12.5 (Anchor) ×3 IMPLANT
BUR RADIUS 4.0X18.5 (BURR) ×3 IMPLANT
BUR RADIUS 5.5 (BURR) ×2 IMPLANT
CANNULA 8.25X9 (CANNULA) ×1 IMPLANT
CANNULA PART THRD DISP 5.75X7 (CANNULA) ×5 IMPLANT
CANNULA PARTIAL THREAD 2X7 (CANNULA) ×3 IMPLANT
CANNULA TWIST IN 8.25X9CM (CANNULA) IMPLANT
CHLORAPREP W/TINT 26 (MISCELLANEOUS) ×2 IMPLANT
COOLER POLAR GLACIER W/PUMP (MISCELLANEOUS) ×2 IMPLANT
DRAPE 3/4 80X56 (DRAPES) ×2 IMPLANT
DRAPE IMP U-DRAPE 54X76 (DRAPES) ×4 IMPLANT
DRAPE INCISE IOBAN 66X45 STRL (DRAPES) ×2 IMPLANT
DRAPE U-SHAPE 47X51 STRL (DRAPES) ×2 IMPLANT
ELECT REM PT RETURN 9FT ADLT (ELECTROSURGICAL)
ELECTRODE REM PT RTRN 9FT ADLT (ELECTROSURGICAL) IMPLANT
GAUZE 4X4 16PLY RFD (DISPOSABLE) ×2 IMPLANT
GAUZE SPONGE 4X4 12PLY STRL (GAUZE/BANDAGES/DRESSINGS) ×2 IMPLANT
GAUZE XEROFORM 1X8 LF (GAUZE/BANDAGES/DRESSINGS) ×2 IMPLANT
GLOVE BIOGEL PI IND STRL 8 (GLOVE) ×1 IMPLANT
GLOVE BIOGEL PI INDICATOR 8 (GLOVE) ×1
GLOVE SURG ORTHO 8.0 STRL STRW (GLOVE) ×4 IMPLANT
GLOVE SURG SYN 8.0 (GLOVE) ×2 IMPLANT
GLOVE SURG SYN 8.0 PF PI (GLOVE) ×1 IMPLANT
GOWN STRL REUS W/ TWL LRG LVL3 (GOWN DISPOSABLE) ×1 IMPLANT
GOWN STRL REUS W/TWL LRG LVL3 (GOWN DISPOSABLE) ×1
GOWN STRL REUS W/TWL XL LVL3 (GOWN DISPOSABLE) ×2 IMPLANT
IV LACTATED RINGER IRRG 3000ML (IV SOLUTION) ×40
IV LR IRRIG 3000ML ARTHROMATIC (IV SOLUTION) ×20 IMPLANT
KIT CORKSCREW KNTLS 3.9 S/T/P (INSTRUMENTS) ×1 IMPLANT
KIT CVD SPEAR FBRTK 1.8 DRILL (KITS) ×1 IMPLANT
KIT INSERTION 2.9 PUSHLOCK (KITS) ×1 IMPLANT
KIT STABILIZATION SHOULDER (MISCELLANEOUS) ×2 IMPLANT
KIT SUTURETAK 3.0 INSERT PERC (KITS) IMPLANT
KIT TURNOVER KIT A (KITS) ×2 IMPLANT
LASSO 25 DEG QUICKPASS CVD LT (SUTURE) ×2 IMPLANT
MANIFOLD NEPTUNE II (INSTRUMENTS) ×2 IMPLANT
MASK FACE SPIDER DISP (MASK) ×2 IMPLANT
MAT ABSORB  FLUID 56X50 GRAY (MISCELLANEOUS) ×2
MAT ABSORB FLUID 56X50 GRAY (MISCELLANEOUS) ×2 IMPLANT
PACK ARTHROSCOPY SHOULDER (MISCELLANEOUS) ×2 IMPLANT
PAD ABD DERMACEA PRESS 5X9 (GAUZE/BANDAGES/DRESSINGS) ×2 IMPLANT
PAD WRAPON POLAR SHDR XLG (MISCELLANEOUS) ×1 IMPLANT
SET TUBE SUCT SHAVER OUTFL 24K (TUBING) ×2 IMPLANT
SET TUBE TIP INTRA-ARTICULAR (MISCELLANEOUS) ×2 IMPLANT
SLING ULTRA II M (MISCELLANEOUS) IMPLANT
SUT ETHILON 3-0 (SUTURE) ×1 IMPLANT
SUT ETHILON 4-0 (SUTURE) ×1
SUT ETHILON 4-0 FS2 18XMFL BLK (SUTURE) ×1
SUT LASSO 90 DEG SD STR (SUTURE) IMPLANT
SUT MNCRL 4-0 (SUTURE)
SUT MNCRL 4-0 27XMFL (SUTURE)
SUTURE ETHLN 4-0 FS2 18XMF BLK (SUTURE) ×1 IMPLANT
SUTURE MNCRL 4-0 27XMF (SUTURE) IMPLANT
TAPE LABRALWHITE 1.5X36 (TAPE) ×1 IMPLANT
TAPE MICROFOAM 4IN (TAPE) ×2 IMPLANT
TAPE SUT LABRALTAP WHT/BLK (SUTURE) ×2 IMPLANT
TUBING ARTHRO INFLOW-ONLY STRL (TUBING) ×3 IMPLANT
TUBING CONNECTING 10 (TUBING) ×2 IMPLANT
WAND WEREWOLF FLOW 90D (MISCELLANEOUS) IMPLANT
WRAPON POLAR PAD SHDR XLG (MISCELLANEOUS) ×2

## 2019-12-09 NOTE — Anesthesia Procedure Notes (Signed)
Anesthesia Regional Block: Interscalene brachial plexus block   Pre-Anesthetic Checklist: ,, timeout performed, Correct Patient, Correct Site, Correct Laterality, Correct Procedure, Correct Position, site marked, Risks and benefits discussed,  Surgical consent,  Pre-op evaluation,  At surgeon's request and post-op pain management  Laterality: Left  Prep: chloraprep       Needles:  Injection technique: Single-shot  Needle Type: Echogenic Needle     Needle Length: 9cm  Needle Gauge: 21     Additional Needles:   Procedures:, nerve stimulator,,, ultrasound used (permanent image in chart),,,,   Nerve Stimulator or Paresthesia:  Response: biceps flexion, 0.8 mA,   Additional Responses:   Narrative:  Start time: 12/09/2019 7:53 AM End time: 12/09/2019 7:56 AM Injection made incrementally with aspirations every 5 mL.  Performed by: Personally   Additional Notes: Functioning IV was confirmed and monitors were applied.  A 27mm 22ga Stimuplex needle was used. Sterile prep and drape,hand hygiene and sterile gloves were used.  Negative aspiration and negative test dose prior to incremental administration of local anesthetic. The patient tolerated the procedure well.

## 2019-12-09 NOTE — Transfer of Care (Signed)
Immediate Anesthesia Transfer of Care Note  Patient: [name] Crosby  Procedure(s) Performed: Left arthroscopic anterior labral repair/capsulorraphy and remplissage procedure (Left Shoulder)  Patient Location: PACU  Anesthesia Type:General and Regional  Level of Consciousness: awake, drowsy and patient cooperative  Airway & Oxygen Therapy: Patient Spontanous Breathing and Patient connected to face mask oxygen  Post-op Assessment: Report given to RN and Post -op Vital signs reviewed and stable  Post vital signs: Reviewed and stable  Last Vitals:  Vitals Value Taken Time  BP 110/62 12/09/19 1243  Temp    Pulse 100 12/09/19 1246  Resp 20 12/09/19 1246  SpO2 100 % 12/09/19 1246  Vitals shown include unvalidated device data.  Last Pain:  Vitals:   12/09/19 0756  TempSrc:   PainSc: 0-No pain         Complications: No apparent anesthesia complications

## 2019-12-09 NOTE — Op Note (Signed)
PRE-OP DIAGNOSIS:  1. Left shoulder multiple anterior dislocations  2. Left shoulder large Hill-Sachs lesion   POST-OP DIAGNOSIS:  1. Left shoulder multiple anterior dislocations  2. Left shoulder large Hill-Sachs lesion   PROCEDURES:  1.  Left shoulder arthroscopic anterior and inferior labral repair and capsulorraphy 2.  Left shoulder Remplissage procedure 3.  Left shoulder extensive glenohumeral and subacromial debridement   SURGEON: Rosealee Albee, MD   ASSISTANT(S):  Sonny Dandy, PA   ANESTHESIA: Regional + Gen   TOTAL IV FLUIDS: see anesthesia record   ESTIMATED BLOOD LOSS: minimal    DRAINS: None    SPECIMENS: None.    IMPLANTS:  Arthrex - 2.15mm PushLock anchor x 3 Arthrex- 1.18mm knotless FiberTak suture anchors x3 Arthrex - 3.65mm  knotless Corkscrew x2   COMPLICATIONS: None    OPERATIVE FINDINGS:  Examination under anesthesia: A careful examination under anesthesia was performed.  Passive range of motion was: FF: 170; ER at side: 75; ER in abduction: 110; IR in abduction: 50.  Anterior load shift: 2+.  Posterior load shift: 1+.  Sulcus in neutral: 2+.  Sulcus in ER: 1+. Hyperabduction: present with ~120 degrees   Intra-operative findings: A thorough arthroscopic examination of the shoulder was performed.  The findings are: 1. Biceps tendon: normal 2. Superior labrum: normal 3. Posterior labrum and capsule: normal 4. Inferior capsule and inferior recess: patulous capsule 5. Glenoid cartilage surface: Grade 1 degenerative changes. Glenoid bone with ~10% loss at 4:30 position.  6. Supraspinatus attachment:  normal 7. Posterior rotator cuff attachment: normal, large Hill-Sachs lesion present 8. Humeral head articular cartilage: Focal area of Grade 1-2 degenerative change 9. Rotator interval: mild synovitis 10: Subscapularis tendon: attachment intact with mild superior fraying of insertional fibers. No exposed footprint 11. Anterior labrum: labral detatchment  from 3 - 6 o'clock positions; patulous anterior and inferior capsule 12. IGHL: stretched, Buford complex present   OPERATIVE REPORT:    Indications for procedure: [name] Crosby is a 34 y.o. 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 The patient has had Multiple prior dislocation episodes with her first occurring in 2013.  She estimates she has had over 100 subluxation/dislocation episodes, and she is now having them with daily motion such as changing clothes and lifting objects away from the body. MRI and CT scan showed detached anteroinferior labral tear with a patulous capsule as well as a very large Hill-Sachs lesion. The patient also had a small bony Bankart lesion affecting approximately 10% of the glenoid width.   There was no further glenoid bone loss noted on the preoperative imaging.  Surgery was recommended for anterior and labral repair and capsulorraphy with remplissage to reduce the risk of recurrent dislocation.      Procedure in detail:   I identified Cherlynn L Uphoff in the pre-operative holding area. The risks, benefits, complications, treatment options, and expected outcomes were discussed with the patient. The risks and potential complications of their problem and purposed treatment including but not limited to failure to fully relieve pain, infection, neurovascular compromise, complications from anesthesia, stiff shoulder, recurrent dislocation, and failure of surgery with continued pain. The patient concurred with the proposed plan, giving informed consent. I marked the operative shoulder with my initials.  Anesthesia was then performed with an interscalene block with Exparil.  The patient was transferred to the operative suite and placed in the beach chair position.     SCDs were placed on the lower extremities. Appropriate IV antibiotics were administered prior to incision.  The operative upper extremity was then prepped and draped in standard fashion. A time out was performed  confirming the correct extremity, correct patient, and correct procedure.    I then created a standard posterior portal with an 11 blade. The glenohumeral joint was easily entered with a blunt trochar and the arthroscope introduced. A high anterior and lateral portal was made. The findings of diagnostic arthroscopy are described above.  I then coagulated the inflamed synovium to obtain hemostasis and reduce the risk of post-operative swelling using an Arthrocare radiofrequency device.  The degenerative fibers of the superior subscapularis were debrided with an oscillating shaver.  Next, an anterior inferior portal was made just lateral to the coracoid.  A 45mm cannula was placed. An elevator was used to elevate the labrum off the glenoid. A rasp was used to roughen the glenoid for improvement of healing. A shaver was also used to debride degenerative labrum and further clean the glenoid face to allow for improved healing.    While viewing from the anterolateral portal,  the Hill-Sachs defect was prepared with a curette.  Next, an Arthrex 3.9 mm knotless corkscrew was placed through an accessory posterolateral portal with cannula in the subacromial space.  The first anchor was placed into the inferior aspect of the Hill-Sachs defect.  A second corkscrew was placed into the superior aspect of the defect.  The repair stitch of the superior anchor was shuttled through the inferior anchor using the shuttling mechanism.  Next, the repair stitch of the inferior anchor was shuttled through the superior mechanism.  Olegario Messier was taken out, but these were not tensioned in order to allow for appropriate visualization for the Bankart repair.   Attention was turned back to the labral repair.  The curved drill guide for the knotless FiberTak was placed at the 6:30 position through the anterior portal.  This was drilled and anchor was placed.  The Arthrex SutureLasso SD (25 degree to the right) was then used to pass the nitinol  wire loop through the capsule and under the labrum from the posterior portal. This was passed an inferior to superior fashion at the level of the anchor.  Repair suture was shuttled through the nitinol wire and then through the anchor using the passing suture.  This was cinched down and cut, nicely tightening the capsule and labrum onto the glenoid while allowing for an inferior to superior capsular shift.  This sequence of events was repeated for 2 additional anchors at the 6:00 and 5:30 positions.  Next, labral tape was used to perform an inferior to superior capsular shift just inferior to the bony Bankart lesion.  An Arthrex 2.9 mm push lock anchor was loaded with this labral tape.  Drill hole for this anchor was placed at the 4:30 position.  Anchor was then advanced with appropriate tensioning until was completely seated.  Tails were then cut.  This was repeated at the 3:30 and 2:30 positions such that the bony Bankart lesion was repaired back to the glenoid along with the anterior labrum. This construct created an excellent inferior and anterior bumper, capsulorrhaphy, and labral repair.   Lastly, the remplissage sutures were tightened sequentially while viewing the Hill-Sachs defect.  With final tightening, the infraspinatus tendon filled the entirety of the Hill-Sachs defect.  Suture tails were cut and tendon was firmly down to bone on probing.   Fluid was evacuated from the shoulder, and the portals were closed with 3-0 Nylon. Xeroform was applied to the portals. A  sterile dressing was applied, followed by a Polar Care sleeve and a SlingShot shoulder immobilizer/sling. The patient awoke from anesthesia without difficulty and was transferred to the PACU in stable condition.    Of note, assistance from a PA was essential to performing the surgery. PA assisted with patient positioning, retraction, and instrumentation. The surgery would have been more difficult and had longer operative time without PA  assistance.    Additionally, this case had increased complexity compared to standard labral repair due to both inferior instability requiring capsulorrhaphy and anchor at the 6:30 position.  Additionally, the patient had a large Hill-Sachs defect with an off track lesion.  This necessitated performing the remplisssage procedure  to reduce the risk of further instability episodes.  This required additional anchors.  Both of these factors increased surgical time by approximately 90 minutes.  COMPLICATIONS: none   DISPOSITION: plan for discharge home after recovery in PACU   POSTOPERATIVE PLAN: Remain in sling (except hygiene and elbow/wrist/hand RoM exercises as instructed by PT) x 4 weeks and NWB for this time. PT to begin 3-4 days after surgery. Anterior shoulder stabilization/capsulorraphy protocol.  Avoid external rotation beyond neutral for the first 4 weeks.

## 2019-12-09 NOTE — H&P (Signed)
Paper H&P to be scanned into permanent record. H&P reviewed. No significant changes noted.  

## 2019-12-09 NOTE — Anesthesia Procedure Notes (Signed)
Procedure Name: Intubation Date/Time: 12/09/2019 8:18 AM Performed by: Gentry Fitz, CRNA Pre-anesthesia Checklist: Patient identified, Emergency Drugs available, Suction available and Patient being monitored Patient Re-evaluated:Patient Re-evaluated prior to induction Oxygen Delivery Method: Circle system utilized Preoxygenation: Pre-oxygenation with 100% oxygen Induction Type: IV induction and Cricoid Pressure applied Ventilation: Mask ventilation without difficulty Laryngoscope Size: Mac and 4 Grade View: Grade I Tube type: Oral Tube size: 7.0 mm Number of attempts: 1 Airway Equipment and Method: Stylet Placement Confirmation: ETT inserted through vocal cords under direct vision,  positive ETCO2 and breath sounds checked- equal and bilateral Secured at: 21 cm Dental Injury: Teeth and Oropharynx as per pre-operative assessment

## 2019-12-09 NOTE — Anesthesia Postprocedure Evaluation (Signed)
Anesthesia Post Note  Patient: [name] Crosby  Procedure(s) Performed: Left arthroscopic anterior labral repair/capsulorraphy and remplissage procedure (Left Shoulder)  Patient location during evaluation: PACU Anesthesia Type: Regional Level of consciousness: awake and alert Pain management: pain level controlled Vital Signs Assessment: post-procedure vital signs reviewed and stable Respiratory status: spontaneous breathing and respiratory function stable Cardiovascular status: stable Anesthetic complications: no     Last Vitals:  Vitals:   12/09/19 1245 12/09/19 1300  BP: 110/62 119/73  Pulse: (!) 101 93  Resp: 19 13  Temp: 36.4 C   SpO2: 100% 100%    Last Pain:  Vitals:   12/09/19 1300  TempSrc:   PainSc: 0-No pain                 Tabathia Knoche K

## 2019-12-09 NOTE — Anesthesia Preprocedure Evaluation (Addendum)
Anesthesia Evaluation  Patient identified by MRN, date of birth, ID band Patient awake    Reviewed: Allergy & Precautions, NPO status , Patient's Chart, lab work & pertinent test results  History of Anesthesia Complications (+) PONV and history of anesthetic complications  Airway Mallampati: II       Dental   Pulmonary neg sleep apnea, neg COPD, Not current smoker, former smoker,           Cardiovascular (-) hypertension(-) Past MI and (-) CHF (-) dysrhythmias      Neuro/Psych neg Seizures Anxiety Depression    GI/Hepatic Neg liver ROS, neg GERD  ,  Endo/Other  neg diabetes  Renal/GU negative Renal ROS     Musculoskeletal   Abdominal   Peds  Hematology   Anesthesia Other Findings   Reproductive/Obstetrics                            Anesthesia Physical Anesthesia Plan  ASA: II  Anesthesia Plan: General   Post-op Pain Management: GA combined w/ Regional for post-op pain   Induction: Intravenous  PONV Risk Score and Plan: 4 or greater and Ondansetron, Dexamethasone, Midazolam and Scopolamine patch - Pre-op  Airway Management Planned: Oral ETT  Additional Equipment:   Intra-op Plan:   Post-operative Plan:   Informed Consent: I have reviewed the patients History and Physical, chart, labs and discussed the procedure including the risks, benefits and alternatives for the proposed anesthesia with the patient or authorized representative who has indicated his/her understanding and acceptance.       Plan Discussed with:   Anesthesia Plan Comments:         Anesthesia Quick Evaluation

## 2019-12-09 NOTE — Discharge Instructions (Addendum)
Post-Op Instructions  1. Bracing: You will wear a shoulder immobilizer or sling for at least 3 weeks. Total time will be determined by type of surgical repair and can be discussed at 1st postop appointment.  2. Driving: No driving for 3 weeks post-op. When driving, do not wear the immobilizer.  3. Activity: No active lifting for 2 months. Wrist, hand, and elbow motion only. Avoid lifting the upper arm away from the body except for hygiene. You are permitted to bend and straighten the elbow actively. You may use your hand and wrist for typing, writing, and managing utensils (cutting food). Do not lift more than a coffee cup for 8 weeks.  When sleeping or resting, inclined positions (recliner chair or wedge pillow) and a pillow under the forearm for support may provide better comfort for up to 4 weeks.  Avoid long distance travel for 4 weeks.  Return to normal activities after labral repair normally takes 6 months on average. If rehab goes very well, may be able to do most activities at 4 months, except overhead or contact sports.  4. Physical Therapy: Begins 3-4 days after surgery, and proceeds 2 times per week for the first 4 weeks, then 1-2 times per week from weeks 4-8 post-op.  5. Medications:  - You will be provided a prescription for narcotic pain medicine. After surgery, take 1-2 narcotic tablets every 4 hours if needed for severe pain.  - A prescription for anti-nausea medication will be provided in case the narcotic medicine causes nausea - take 1 tablet every 6 hours only if nauseated.   - Take tylenol 1000 mg every 8 hours for pain.  May stop tylenol 3 days after surgery if you are having minimal pain.  If you are taking prescription medication for anxiety, depression, insomnia, muscle spasm, chronic pain, or for attention deficit disorder, you are advised that you are at a higher risk of adverse effects with use of narcotics post-op, including narcotic addiction/dependence, depressed  breathing, death. If you use non-prescribed substances: alcohol, marijuana, cocaine, heroin, methamphetamines, etc., you are at a higher risk of adverse effects with use of narcotics post-op, including narcotic addiction/dependence, depressed breathing, death. You are advised that taking > 50 morphine milligram equivalents (MME) of narcotic pain medication per day results in twice the risk of overdose or death. For your prescription provided: oxycodone 5 mg - taking more than 6 tablets per day would result in > 50 morphine milligram equivalents (MME) of narcotic pain medication. Be advised that we will prescribe narcotics short-term, for acute post-operative pain only - 3 weeks for major operations such as shoulder repair/reconstruction surgeries.   6. Post-Op Appointment:  Your first post-op appointment will be 10-14 days post-op.  7. Work or School: For most, but not all procedures, we advise staying out of work or school for at least 1 to 2 weeks in order to recover from the stress of surgery and to allow time for healing.   If you need a work or school note this can be provided.   Post-operative Brace: Apply and remove the brace you received as you were instructed to at the time of fitting and as described in detail as the brace's instructions for use indicate.  Wear the brace for the period of time prescribed by your physician.  The brace can be cleaned with soap and water and allowed to air dry only.  Should the brace result in increased pain, decreased feeling (numbness/tingling), increased swelling or an overall worsening  of your medical condition, please contact your doctor immediately.  If an emergency situation occurs as a result of wearing the brace after normal business hours, please dial 911 and seek immediate medical attention.  Let your doctor know if you have any further questions about the brace issued to you. Refer to the shoulder sling instructions for use if you have any questions  regarding the correct fit of your shoulder sling.  [facility] Customer Care for Troubleshooting: [phone]  Video that illustrates how to properly use a shoulder sling: "Instructions for Proper Use of an Orthopaedic Sling" http://bass.com/   AMBULATORY SURGERY  DISCHARGE INSTRUCTIONS   1) The drugs that you were given will stay in your system until tomorrow so for the next 24 hours you should not:  A) Drive an automobile B) Make any legal decisions C) Drink any alcoholic beverage   2) You may resume regular meals tomorrow.  Today it is better to start with liquids and gradually work up to solid foods.  You may eat anything you prefer, but it is better to start with liquids, then soup and crackers, and gradually work up to solid foods.   3) Please notify your doctor immediately if you have any unusual bleeding, trouble breathing, redness and pain at the surgery site, drainage, fever, or pain not relieved by medication.    4) Additional Instructions:        Please contact your physician with any problems or Same Day Surgery at [phone], Monday through Friday 6 am to 4 pm, or [place] at [facility] number at [phone].

## 2020-09-30 ENCOUNTER — Encounter: Payer: Self-pay | Admitting: Orthopedic Surgery

## 2022-01-27 IMAGING — MR MR SHOULDER*L* W/ CM
5 series · 40 of 40 positions shown · IV contrast (agent unspecified)
Comparison: None.

CLINICAL DATA: Chronic, intermittent left shoulder pain and limited
range of motion.

EXAM:
MR ARTHROGRAM OF THE LEFT SHOULDER
TECHNIQUE: Multiplanar, multisequence MR imaging of the left shoulder was
performed following the administration of intra-articular contrast.
CONTRAST:  Image from contrast injection reviewed. CT left shoulder
11/26/2019.

[Series 5: T1 fat-sat · axial · left · 4.0mm · 0.55mm/px · z∈[-11,+109]mm · 8 of 25 slices shown (1 of 2)]
[im 1/25]
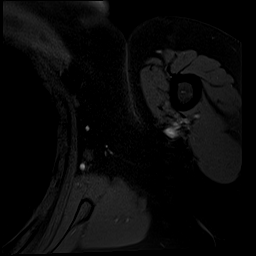
[im 4/25]
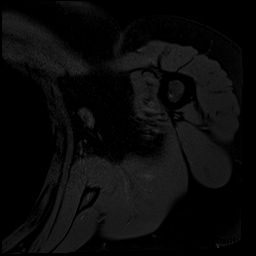
[im 7/25]
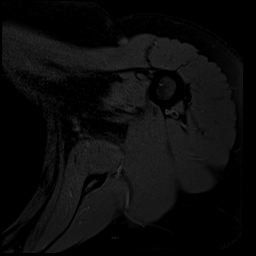
[im 11/25]
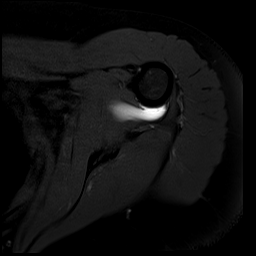
[im 14/25]
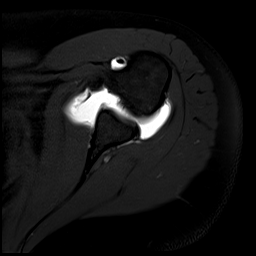
[im 18/25]
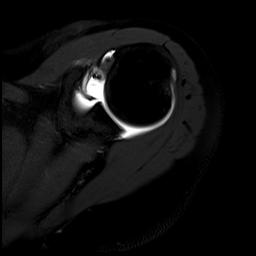
[im 21/25]
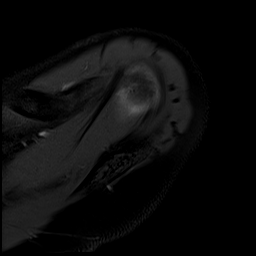
[im 25/25]
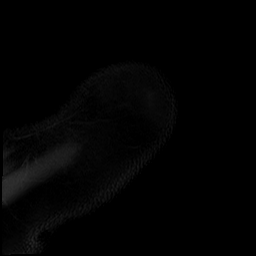

[Series 6: T1 fat-sat · oblique · left · 4.0mm · 0.55mm/px · 8 of 25 slices shown (2 of 2)]
[im 1/25]
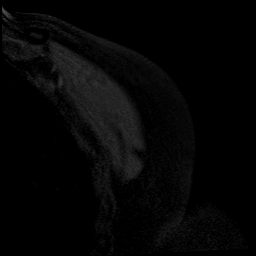
[im 4/25]
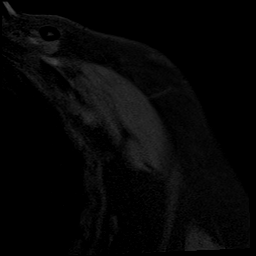
[im 7/25]
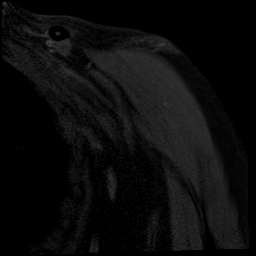
[im 11/25]
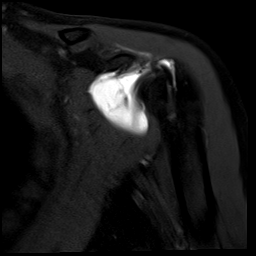
[im 14/25]
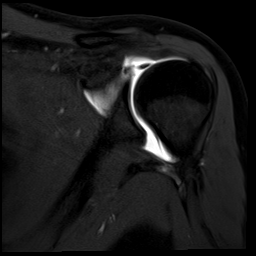
[im 18/25]
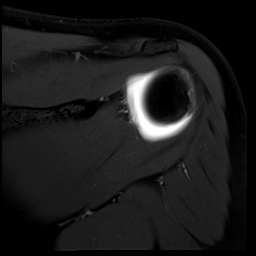
[im 21/25]
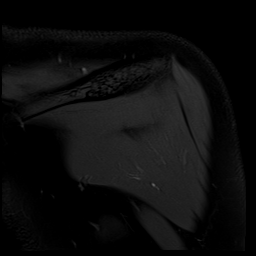
[im 25/25]
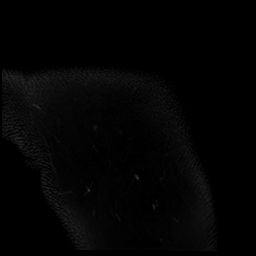

[Series 7: T2 fat-sat · oblique · left · 4.0mm · 0.55mm/px · 8 of 26 slices shown (1 of 2)]
[im 1/26]
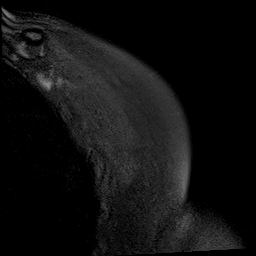
[im 4/26]
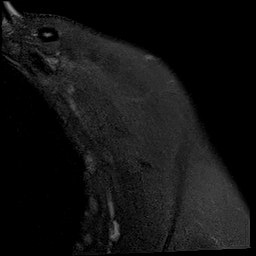
[im 8/26]
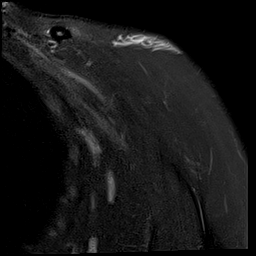
[im 11/26]
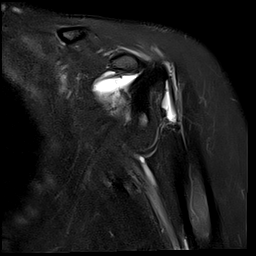
[im 15/26]
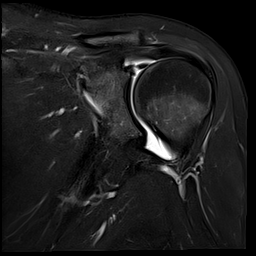
[im 18/26]
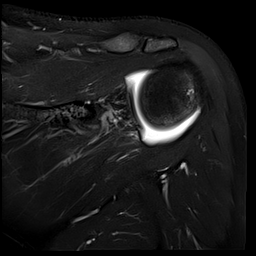
[im 22/26]
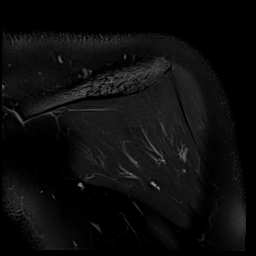
[im 26/26]
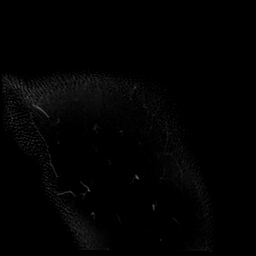

[Series 8: T1 · oblique · left · 4.0mm · 0.51mm/px · 8 of 26 slices shown]
[im 1/26]
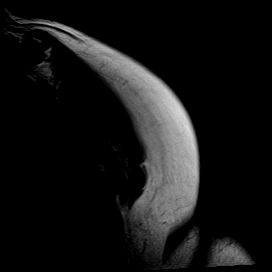
[im 4/26]
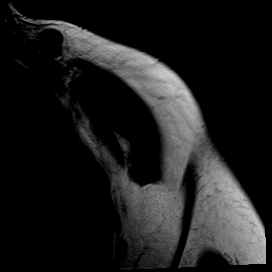
[im 8/26]
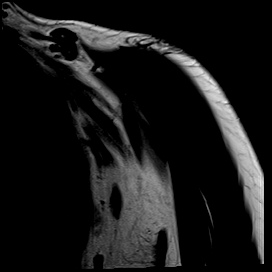
[im 11/26]
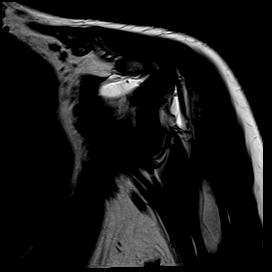
[im 15/26]
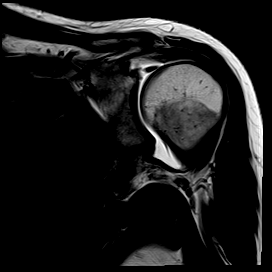
[im 18/26]
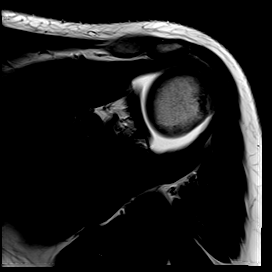
[im 22/26]
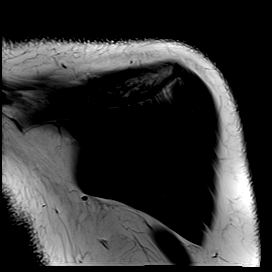
[im 26/26]
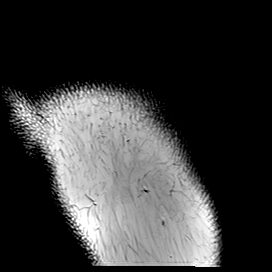

[Series 9: T2 fat-sat · oblique · left · 4.0mm · 0.55mm/px · 8 of 25 slices shown (2 of 2)]
[im 1/25]
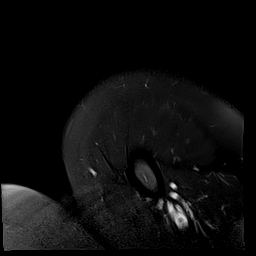
[im 4/25]
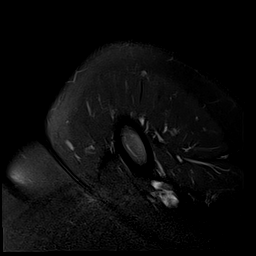
[im 7/25]
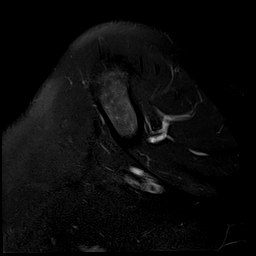
[im 11/25]
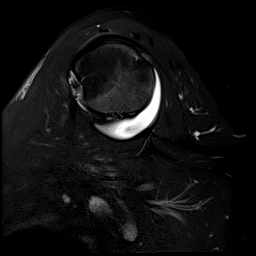
[im 14/25]
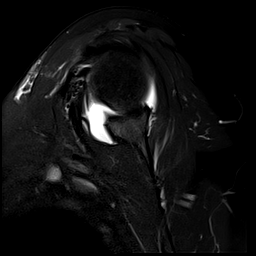
[im 18/25]
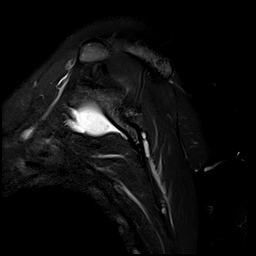
[im 21/25]
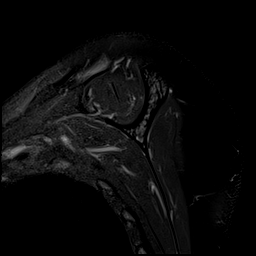
[im 25/25]
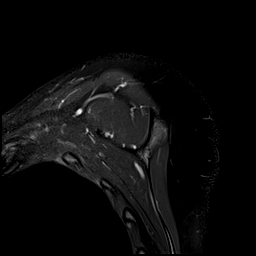

[40 of 40 positions shown; findings below may reference images not displayed]

FINDINGS: Rotator cuff: Intact.

Muscles: Normal without atrophy or focal lesion.

Biceps long head: Intact.

Acromioclavicular Joint: Normal.

Glenohumeral Joint: Distended with contrast.

Labrum: The anterior, inferior labrum is not visualized consistent
with maceration from prior shoulder dislocation. The superior labrum
is diminutive but no tear is seen.

Bones: Small bony Bankart identified on prior CT is difficult to
visualize on this examination. Small, remote Hill-Sachs lesion is
noted. No acute bony abnormality.
IMPRESSION: Findings consistent with remote anterior shoulder dislocation. The
anterior, inferior labrum is not visualized consistent with
maceration from dislocation. Small bony Bankart seen on prior CT
scan is not visible on this exam. Small, remote Hill-Sachs lesion is
noted.

Degenerated superior labrum without focal tear identified.

Intact rotator cuff and long head of biceps.
